# Patient Record
Sex: Male | Born: 1990
Health system: Southern US, Community
[De-identification: ages and names within clinical notes are randomized; demographics above are authoritative.]

## PROBLEM LIST (undated history)

## (undated) DIAGNOSIS — K921 Melena: Secondary | ICD-10-CM

## (undated) DIAGNOSIS — R0683 Snoring: Secondary | ICD-10-CM

## (undated) DIAGNOSIS — I10 Essential (primary) hypertension: Secondary | ICD-10-CM

## (undated) DIAGNOSIS — D649 Anemia, unspecified: Secondary | ICD-10-CM

## (undated) DIAGNOSIS — F419 Anxiety disorder, unspecified: Secondary | ICD-10-CM

## (undated) DIAGNOSIS — B2790 Infectious mononucleosis, unspecified without complication: Secondary | ICD-10-CM

## (undated) DIAGNOSIS — I351 Nonrheumatic aortic (valve) insufficiency: Secondary | ICD-10-CM

## (undated) HISTORY — DX: Nonrheumatic aortic (valve) insufficiency: I35.1

## (undated) HISTORY — DX: Snoring: R06.83

## (undated) HISTORY — DX: Anemia, unspecified: D64.9

## (undated) HISTORY — DX: Melena: K92.1

## (undated) HISTORY — PX: NO PAST SURGERIES: SHX2092

## (undated) HISTORY — DX: Anxiety disorder, unspecified: F41.9

---

## 2012-05-19 ENCOUNTER — Emergency Department (HOSPITAL_COMMUNITY)
Admission: EM | Admit: 2012-05-19 | Discharge: 2012-05-20 | Discharge status: Against Medical Advice | Payer: Self-pay | Attending: Emergency Medicine | Admitting: Emergency Medicine

## 2012-05-19 ENCOUNTER — Encounter (HOSPITAL_COMMUNITY): Payer: Self-pay | Admitting: Emergency Medicine

## 2012-05-19 DIAGNOSIS — K1370 Unspecified lesions of oral mucosa: Secondary | ICD-10-CM | POA: Insufficient documentation

## 2012-05-19 DIAGNOSIS — I10 Essential (primary) hypertension: Secondary | ICD-10-CM | POA: Insufficient documentation

## 2012-05-19 HISTORY — DX: Essential (primary) hypertension: I10

## 2012-05-19 HISTORY — DX: Infectious mononucleosis, unspecified without complication: B27.90

## 2012-05-19 NOTE — ED Notes (Signed)
Pt states he takes blood pressure medication, name unknown, and has been out of it for the past 3 days

## 2012-05-19 NOTE — ED Notes (Signed)
Pt states he has bumps on the inside of his mouth some on the left side and one on the right side  Pt states they do not hurt  Pt states he just noticed them today

## 2012-06-05 ENCOUNTER — Emergency Department (HOSPITAL_COMMUNITY)
Admission: EM | Admit: 2012-06-05 | Discharge: 2012-06-05 | Disposition: A | Discharge status: Home / Self Care | Payer: Self-pay | Attending: Emergency Medicine | Admitting: Emergency Medicine

## 2012-06-05 ENCOUNTER — Encounter (HOSPITAL_COMMUNITY): Payer: Self-pay | Admitting: Emergency Medicine

## 2012-06-05 DIAGNOSIS — J3489 Other specified disorders of nose and nasal sinuses: Secondary | ICD-10-CM | POA: Insufficient documentation

## 2012-06-05 DIAGNOSIS — Z8619 Personal history of other infectious and parasitic diseases: Secondary | ICD-10-CM | POA: Insufficient documentation

## 2012-06-05 DIAGNOSIS — J029 Acute pharyngitis, unspecified: Secondary | ICD-10-CM | POA: Insufficient documentation

## 2012-06-05 DIAGNOSIS — I10 Essential (primary) hypertension: Secondary | ICD-10-CM | POA: Insufficient documentation

## 2012-06-05 DIAGNOSIS — Z79899 Other long term (current) drug therapy: Secondary | ICD-10-CM | POA: Insufficient documentation

## 2012-06-05 MED ORDER — LORATADINE 10 MG PO TABS: 10.0000 mg | ORAL_TABLET | Freq: Every day | ORAL | Status: DC

## 2012-06-05 MED ORDER — OMEPRAZOLE 20 MG PO CPDR: 20.0000 mg | DELAYED_RELEASE_CAPSULE | Freq: Every day | ORAL | Status: DC

## 2012-06-05 NOTE — ED Notes (Signed)
PT. REPORTS SORE THROAT AND NASAL CONGESTION FOR SEVERAL DAYS .

## 2012-06-05 NOTE — ED Provider Notes (Signed)
History     CSN: [id number]  Arrival date & time 06/05/12  0214   First MD Initiated Contact with Patient 06/05/12 [phone]      Chief Complaint  Patient presents with  . Sore Throat  . Nasal Congestion    (Consider location/radiation/quality/duration/timing/severity/associated sxs/prior treatment) Patient is a 22 y.o. male presenting with pharyngitis. The history is provided by the patient.  Sore Throat This is a new problem. The current episode started 3 to 5 hours ago. The problem occurs constantly. The problem has not changed since onset.Nothing aggravates the symptoms. Nothing relieves the symptoms. He has tried nothing for the symptoms.    Past Medical History  Diagnosis Date  . Hypertension   . Mononucleosis     History reviewed. No pertinent past surgical history.  Family History  Problem Relation Age of Onset  . Hypertension Other   . Diabetes Other     History  Substance Use Topics  . Smoking status: Never Smoker   . Smokeless tobacco: Not on file  . Alcohol Use: No      Review of Systems  All other systems reviewed and are negative.    Allergies  Review of patient's allergies indicates no known allergies.  Home Medications   Current Outpatient Rx  Name  Route  Sig  Dispense  Refill  . omeprazole (PRILOSEC) 20 MG capsule   Oral   Take 1 capsule (20 mg total) by mouth daily.   30 capsule   0   . PRESCRIPTION MEDICATION      Blood pressure medication will call pharmacy to update records.         . vitamin C (ASCORBIC ACID) 500 MG tablet   Oral   Take 1,000 mg by mouth daily.           BP 167/103  Pulse 72  Temp(Src) 98.3 F (36.8 C) (Oral)  Resp 14  SpO2 97%  Physical Exam  Constitutional: He is oriented to person, place, and time. He appears well-developed and well-nourished.  HENT:  Head: Normocephalic and atraumatic.  Eyes: Conjunctivae are normal. Pupils are equal, round, and reactive to light.  Neck: Normal range of  motion. Neck supple.  Cardiovascular: Normal rate, regular rhythm, normal heart sounds and intact distal pulses.   Pulmonary/Chest: Effort normal and breath sounds normal.  Abdominal: Soft. Bowel sounds are normal.  Neurological: He is alert and oriented to person, place, and time.  Skin: Skin is warm and dry.  Psychiatric: He has a normal mood and affect. His behavior is normal. Judgment and thought content normal.    ED Course  Procedures (including critical care time)  Labs Reviewed - No data to display No results found.   1. Sore throat       MDM  + sore throat,  Uri sxs.  Noted elevated bp.  Advised to not take any stimulants, and have bp rechecked in the next several days        Emmet Messer Lytle Michaels, MD 06/05/12 [phone]

## 2012-06-05 NOTE — ED Notes (Signed)
Pt discharged.Vital signs stable and GCS 15 

## 2012-06-24 ENCOUNTER — Emergency Department (HOSPITAL_COMMUNITY)
Admission: EM | Admit: 2012-06-24 | Discharge: 2012-06-24 | Disposition: A | Discharge status: Home / Self Care | Payer: Self-pay | Attending: Emergency Medicine | Admitting: Emergency Medicine

## 2012-06-24 ENCOUNTER — Encounter (HOSPITAL_COMMUNITY): Payer: Self-pay | Admitting: Nurse Practitioner

## 2012-06-24 DIAGNOSIS — K219 Gastro-esophageal reflux disease without esophagitis: Secondary | ICD-10-CM | POA: Insufficient documentation

## 2012-06-24 DIAGNOSIS — Z8619 Personal history of other infectious and parasitic diseases: Secondary | ICD-10-CM | POA: Insufficient documentation

## 2012-06-24 DIAGNOSIS — J3489 Other specified disorders of nose and nasal sinuses: Secondary | ICD-10-CM | POA: Insufficient documentation

## 2012-06-24 DIAGNOSIS — I10 Essential (primary) hypertension: Secondary | ICD-10-CM | POA: Insufficient documentation

## 2012-06-24 DIAGNOSIS — J029 Acute pharyngitis, unspecified: Secondary | ICD-10-CM | POA: Insufficient documentation

## 2012-06-24 DIAGNOSIS — Z79899 Other long term (current) drug therapy: Secondary | ICD-10-CM | POA: Insufficient documentation

## 2012-06-24 NOTE — ED Provider Notes (Signed)
History    This chart was scribed for non-physician practitioner Junious Silk, PA-C working with Gwyneth Sprout, MD by Gerlean Ren, ED Scribe. This patient was seen in room TR05C/TR05C and the patient's care was started at 4:55 PM.   CSN: [id number]  Arrival date & time 06/24/12  1426   First MD Initiated Contact with Patient 06/24/12 1556      Chief Complaint  Patient presents with  . Sore Throat     The history is provided by the patient. No language interpreter was used.  Vincent Ibarra is a 22 y.o. male who presents to the Emergency Department complaining of 2 months of waxing-and-waning sore throat that lasts about one hour at a time with associated nasal congestion.  Pt reported here today because he noticed white spots in back of throat this morning.  Pain is worse when waking up in the morning.  No associated painful swallowing.  Pt reports he was diagnosed with acid reflux one week ago for which he has been taking meds he has prescribed.  Pt denies fever, abdominal pain, nausea, emesis. No PCP.    Past Medical History  Diagnosis Date  . Hypertension   . Mononucleosis     History reviewed. No pertinent past surgical history.  Family History  Problem Relation Age of Onset  . Hypertension Other   . Diabetes Other     History  Substance Use Topics  . Smoking status: Never Smoker   . Smokeless tobacco: Not on file  . Alcohol Use: No      Review of Systems  Constitutional: Negative for fever and chills.  HENT: Positive for congestion and sore throat.   Gastrointestinal: Negative for nausea, vomiting and abdominal pain.  All other systems reviewed and are negative.    Allergies  Review of patient's allergies indicates no known allergies.  Home Medications   Current Outpatient Rx  Name  Route  Sig  Dispense  Refill  . loratadine (CLARITIN) 10 MG tablet   Oral   Take 1 tablet (10 mg total) by mouth daily.   30 tablet   0   . omeprazole (PRILOSEC) 20  MG capsule   Oral   Take 1 capsule (20 mg total) by mouth daily.   30 capsule   0   . PRESCRIPTION MEDICATION      Blood pressure medication will call pharmacy to update records.         . vitamin C (ASCORBIC ACID) 500 MG tablet   Oral   Take 1,000 mg by mouth daily.           BP 153/63  Pulse 64  Temp(Src) 98.9 F (37.2 C) (Oral)  Resp 16  SpO2 96%  Physical Exam  Nursing note and vitals reviewed. Constitutional: He is oriented to person, place, and time. He appears well-developed and well-nourished. No distress.  HENT:  Head: Normocephalic and atraumatic.  Right Ear: Tympanic membrane, external ear and ear canal normal.  Left Ear: Tympanic membrane, external ear and ear canal normal.  Nose: Nose normal.  Mouth/Throat: Uvula is midline, oropharynx is clear and moist and mucous membranes are normal.  Bilateral TM normal  Eyes: Conjunctivae are normal.  Neck: Normal range of motion. No tracheal deviation present.  No lymphadenopathy  Cardiovascular: Normal rate, regular rhythm and normal heart sounds.   Pulmonary/Chest: Effort normal and breath sounds normal. No stridor.  Abdominal: Soft. He exhibits no distension. There is no tenderness.  Musculoskeletal: Normal range of motion.  Neurological: He is alert and oriented to person, place, and time.  Skin: Skin is warm and dry. He is not diaphoretic.  Psychiatric: He has a normal mood and affect. His behavior is normal.    ED Course  Procedures (including critical care time) DIAGNOSTIC STUDIES: Oxygen Saturation is 96% on room air, adequate by my interpretation.    COORDINATION OF CARE: 5:00 PM- Informed pt that strep test was negative and that pt does not appear to have signs of infection.  Pt requests resource list for establishing contact with local PCP.  Informed pt of strict return precautions.  Pt verbalizes understanding.    Results for orders placed during the hospital encounter of 06/24/12  RAPID STREP  SCREEN      Result Value Range   Streptococcus, Group A Screen (Direct) NEGATIVE  NEGATIVE    No results found.   1. Pharyngitis       MDM  Patient presents for a waxing and waning sore throat for 2 months. Noticed a white spot on his throat this am and decided to get checked out for strep. Rapid strep negative. Afebrile. No other complaints. Followed by ENT for this problem. No signs of any acute pathology. Resource guide given to establish care with PCP in the community. Return precautions given. VSS for discharge. Patient / Family / Caregiver informed of clinical course, understand medical decision-making process, and agree with plan.       I personally performed the services described in this documentation, which was scribed in my presence. The recorded information has been reviewed and is accurate.    Mora Bellman, PA-C 06/25/12 1358

## 2012-06-24 NOTE — ED Notes (Signed)
Spoke with pt regarding importance of followup with a PCP regarding his BP. During discharge pt stated that he did followup with an ENT doctor and he was given a rx for antibiotics, pt states he has not finished taking the ones rx'd by ED or ENT. Advised pt to continue taking medications that the ENT asked him to. Pt verbalized understanding.

## 2012-06-24 NOTE — ED Notes (Signed)
Pt reports he was here last week for sore throat and diagnosed with acid reflux but sore throat persists and now has white spots in his throat. A&Ox4, resp e/u

## 2012-06-25 NOTE — ED Provider Notes (Signed)
Medical screening examination/treatment/procedure(s) were performed by non-physician practitioner and as supervising physician I was immediately available for consultation/collaboration.   Gwyneth Sprout, MD 06/25/12 2147

## 2013-02-02 ENCOUNTER — Encounter (HOSPITAL_COMMUNITY): Payer: Self-pay | Admitting: Emergency Medicine

## 2013-02-02 ENCOUNTER — Emergency Department (HOSPITAL_COMMUNITY)
Admission: EM | Admit: 2013-02-02 | Discharge: 2013-02-02 | Disposition: A | Discharge status: Home / Self Care | Payer: Self-pay | Attending: Emergency Medicine | Admitting: Emergency Medicine

## 2013-02-02 DIAGNOSIS — I10 Essential (primary) hypertension: Secondary | ICD-10-CM | POA: Insufficient documentation

## 2013-02-02 DIAGNOSIS — J029 Acute pharyngitis, unspecified: Secondary | ICD-10-CM | POA: Insufficient documentation

## 2013-02-02 DIAGNOSIS — Z8619 Personal history of other infectious and parasitic diseases: Secondary | ICD-10-CM | POA: Insufficient documentation

## 2013-02-02 DIAGNOSIS — Z79899 Other long term (current) drug therapy: Secondary | ICD-10-CM | POA: Insufficient documentation

## 2013-02-02 LAB — RAPID STREP SCREEN (MED CTR MEBANE ONLY): Streptococcus, Group A Screen (Direct): NEGATIVE

## 2013-02-02 MED ORDER — ACETAMINOPHEN 325 MG PO TABS
650.0000 mg | ORAL_TABLET | Freq: Once | ORAL | Status: AC
  Administered 2013-02-02: 650 mg via ORAL
  Filled 2013-02-02: qty 2

## 2013-02-02 NOTE — ED Notes (Signed)
Pt was seen by the pa before myself.  Alert no distress

## 2013-02-02 NOTE — ED Notes (Signed)
Present with chills, sore thraot worse with swallowing that began last night. Reports fever of 100.8 today. C/o of stuffy nose for 6 months. VSS.

## 2013-02-02 NOTE — ED Provider Notes (Signed)
CSN: [id number]     Arrival date & time 02/02/13  2136 History   None    This chart was scribed for non-physician practitioner, Roxy Horseman PA-C,  working with Roney Marion, MD by Arlan Organ, ED Scribe. This patient was seen in room TR07C/TR07C and the patient's care was started at 10:25 PM.    Chief Complaint  Patient presents with  . Sore Throat   The history is provided by the patient. No language interpreter was used.   HPI Comments: Vincent Ibarra is a 22 y.o. male with a hx of monocucleosis who presents to the Emergency Department complaining of a sudden onset, unchanged, constant sore throat that started last night. Pt also reports dysphagia, chills, rhinorrhea, dry cough, and a subjective fever. He says he has tried dayquil with mild temporary relief. Pt denies nasal congestion.  Past Medical History  Diagnosis Date  . Hypertension   . Mononucleosis    History reviewed. No pertinent past surgical history. Family History  Problem Relation Age of Onset  . Hypertension Other   . Diabetes Other    History  Substance Use Topics  . Smoking status: Never Smoker   . Smokeless tobacco: Not on file  . Alcohol Use: No    Review of Systems A complete 10 system review of systems was obtained and all systems are negative except as noted in the HPI and PMH.    Allergies  Review of patient's allergies indicates no known allergies.  Home Medications   Current Outpatient Rx  Name  Route  Sig  Dispense  Refill  . loratadine (CLARITIN) 10 MG tablet   Oral   Take 1 tablet (10 mg total) by mouth daily.   30 tablet   0   . omeprazole (PRILOSEC) 20 MG capsule   Oral   Take 1 capsule (20 mg total) by mouth daily.   30 capsule   0   . PRESCRIPTION MEDICATION      Blood pressure medication will call pharmacy to update records.         . vitamin C (ASCORBIC ACID) 500 MG tablet   Oral   Take 1,000 mg by mouth daily.          Triage Vitals: BP 156/75  Pulse 104   Temp(Src) 100.8 F (38.2 C) (Oral)  Resp 18  Wt 163 lb 8 oz (74.163 kg)  SpO2 98%  Physical Exam  Nursing note and vitals reviewed. Constitutional: He is oriented to person, place, and time. He appears well-developed and well-nourished.  No sick contacts   HENT:  Head: Normocephalic and atraumatic.  Right Ear: Tympanic membrane is not erythematous.  Left Ear: Tympanic membrane normal. Tympanic membrane is not erythematous.  Mouth/Throat: No oropharyngeal exudate.  No sinus pressure, oropharynx is mildly erythematous, no signs of tonsillar or peritonsillar abscess, uvula is midline, airway is intact  Eyes: EOM are normal.  Neck: Normal range of motion.  Cardiovascular: Normal rate.   Pulmonary/Chest: Effort normal.  Musculoskeletal: Normal range of motion.  Neck stiffness  Lymphadenopathy:    He has no cervical adenopathy.  Neurological: He is alert and oriented to person, place, and time.  Skin: Skin is warm and dry.  Psychiatric: He has a normal mood and affect. His behavior is normal.    ED Course  Procedures (including critical care time)  DIAGNOSTIC STUDIES: Oxygen Saturation is 98% on RA, Normal by my interpretation.    COORDINATION OF CARE: 10:26 PM- Will order strep  screening. Discussed treatment plan with pt at bedside and pt agreed to plan.      Results for orders placed during the hospital encounter of 02/02/13  RAPID STREP SCREEN      Result Value Range   Streptococcus, Group A Screen (Direct) NEGATIVE  NEGATIVE   No results found.    EKG Interpretation   None       MDM   1. Viral pharyngitis    Pt afebrile without tonsillar exudate, negative strep. Presents with mild cervical lymphadenopathy, & dysphagia; diagnosis of viral pharyngitis. No abx indicated. DC w symptomatic tx for pain  Pt does not appear dehydrated, but did discuss importance of water rehydration. Presentation non concerning for PTA or infxn spread to soft tissue. No trismus or  uvula deviation. Specific return precautions discussed. Pt able to drink water in ED without difficulty with intact air way. Recommended PCP follow up.   I personally performed the services described in this documentation, which was scribed in my presence. The recorded information has been reviewed and is accurate.     Roxy Horseman, PA-C 02/02/13 2338

## 2013-02-04 LAB — CULTURE, GROUP A STREP

## 2013-02-05 NOTE — ED Provider Notes (Signed)
Medical screening examination/treatment/procedure(s) were performed by non-physician practitioner and as supervising physician I was immediately available for consultation/collaboration.  EKG Interpretation   None         Roney Marion, MD 02/05/13 [phone]

## 2013-08-10 ENCOUNTER — Emergency Department (HOSPITAL_COMMUNITY)
Admission: EM | Admit: 2013-08-10 | Discharge: 2013-08-10 | Disposition: A | Discharge status: Home / Self Care | Payer: Self-pay | Attending: Emergency Medicine | Admitting: Emergency Medicine

## 2013-08-10 ENCOUNTER — Encounter (HOSPITAL_COMMUNITY): Payer: Self-pay | Admitting: Emergency Medicine

## 2013-08-10 DIAGNOSIS — L02419 Cutaneous abscess of limb, unspecified: Secondary | ICD-10-CM | POA: Insufficient documentation

## 2013-08-10 DIAGNOSIS — L03119 Cellulitis of unspecified part of limb: Principal | ICD-10-CM

## 2013-08-10 DIAGNOSIS — L0291 Cutaneous abscess, unspecified: Secondary | ICD-10-CM

## 2013-08-10 DIAGNOSIS — I10 Essential (primary) hypertension: Secondary | ICD-10-CM | POA: Insufficient documentation

## 2013-08-10 NOTE — ED Notes (Signed)
Pt c/o abscess to right upper thigh x's 5 days.  St's has had some drainage

## 2013-08-10 NOTE — Discharge Instructions (Signed)
Abscess Care After An abscess (also called a boil or furuncle) is an infected area that contains a collection of pus. Signs and symptoms of an abscess include pain, tenderness, redness, or hardness, or you may feel a moveable soft area under your skin. An abscess can occur anywhere in the body. The infection may spread to surrounding tissues causing cellulitis. A cut (incision) by the surgeon was made over your abscess and the pus was drained out. Gauze may have been packed into the space to provide a drain that will allow the cavity to heal from the inside outwards. The boil may be painful for 5 to 7 days. Most people with a boil do not have high fevers. Your abscess, if seen early, may not have localized, and may not have been lanced. If not, another appointment may be required for this if it does not get better on its own or with medications. HOME CARE INSTRUCTIONS   Only take over-the-counter or prescription medicines for pain, discomfort, or fever as directed by your caregiver.  When you bathe, soak and then remove gauze or iodoform packs at least daily or as directed by your caregiver. You may then wash the wound gently with mild soapy water. Repack with gauze or do as your caregiver directs. SEEK IMMEDIATE MEDICAL CARE IF:   You develop increased pain, swelling, redness, drainage, or bleeding in the wound site.  You develop signs of generalized infection including muscle aches, chills, fever, or a general ill feeling.  An oral temperature above 102 F (38.9 C) develops, not controlled by medication. See your caregiver for a recheck if you develop any of the symptoms described above. If medications (antibiotics) were prescribed, take them as directed. Document Released: 09/15/2004 Document Revised: 05/22/2011 Document Reviewed: 05/13/2007 [facility] Patient Information 2014 [place], [place]. The area will drain for the next couple of days  Warm compress to area 2-3 times a day for the next 1-2  days

## 2013-08-10 NOTE — ED Provider Notes (Signed)
CSN: [id number]     Arrival date & time 08/10/13  2005 History   None    This chart was scribed for non-physician practitioner, Earley FavorGail Sephiroth Mcluckie, FNP working with Dagmar HaitWilliam Blair Walden, MD by Arlan OrganAshley Leger, ED Scribe. This patient was seen in room TR11C/TR11C and the patient's care was started at 8:18 PM.   Chief Complaint  Patient presents with  . Abscess   HPI  HPI Comments: Vincent Ibarra is a 23 y.o. Male with a PMHx of HTN who presents to the Emergency Department complaining of an abscess to the R upper thigh onset 5 days. Pt also reports some drainage from the area. States he opened the area with a safety pin a few days ago. He has not tried any OTC medications for discomfort. No warm compresses to help with drainage. At this time he denies any fever or chills. He has no other pertinent past medical history. No other concerns this visit.  Past Medical History  Diagnosis Date  . Hypertension   . Mononucleosis    History reviewed. No pertinent past surgical history. Family History  Problem Relation Age of Onset  . Hypertension Other   . Diabetes Other    History  Substance Use Topics  . Smoking status: Never Smoker   . Smokeless tobacco: Not on file  . Alcohol Use: No    Review of Systems  Constitutional: Negative for fever and chills.  HENT: Negative for congestion.   Eyes: Negative for redness.  Respiratory: Negative for cough.   Skin: Positive for wound (Abscess to R upper thigh). Negative for rash.  Psychiatric/Behavioral: Negative for confusion.      Allergies  Review of patient's allergies indicates no known allergies.  Home Medications   Prior to Admission medications   Medication Sig Start Date End Date Taking? Authorizing Provider  loratadine (CLARITIN) 10 MG tablet Take 1 tablet (10 mg total) by mouth daily. 06/05/12   Chionesu Lytle MichaelsK Sonyika, MD  Pseudoephedrine-APAP-DM (DAYQUIL MULTI-SYMPTOM COLD/FLU PO) Take 30 mLs by mouth every 4 (four) hours as needed  (for cold).    Historical Provider, MD   Triage Vitals: BP 154/82  Pulse 94  Temp(Src) 98.4 F (36.9 C) (Oral)  Resp 18  SpO2 100%   Physical Exam  Nursing note and vitals reviewed. Constitutional: He is oriented to person, place, and time. He appears well-developed and well-nourished.  HENT:  Head: Normocephalic.  Eyes: EOM are normal.  Neck: Normal range of motion.  Pulmonary/Chest: Effort normal.  Abdominal: He exhibits no distension.  Musculoskeletal: Normal range of motion.  Neurological: He is alert and oriented to person, place, and time.  Skin:  10-12 cm area of induration with a 3 cm central firm area to R upper thigh 3 mm opening with drainage to R upper thigh  Psychiatric: He has a normal mood and affect.    ED Course  INCISION AND DRAINAGE Date/Time: 08/10/2013 8:59 PM Performed by: Arman FilterSCHULZ, Shawna Kiener K Authorized by: Arman FilterSCHULZ, Paizley Ramella K Consent: Verbal consent obtained. written consent not obtained. Consent given by: patient Patient understanding: patient states understanding of the procedure being performed Patient identity confirmed: verbally with patient Time out: Immediately prior to procedure a "time out" was called to verify the correct patient, procedure, equipment, support staff and site/side marked as required. Type: abscess Body area: lower extremity Location details: right leg Anesthesia: local infiltration Local anesthetic: lidocaine 1% without epinephrine Patient sedated: no Scalpel size: 11 Complexity: simple Drainage: purulent Drainage amount: moderate Wound treatment:  wound left open Patient tolerance: Patient tolerated the procedure well with no immediate complications. Comments: Small seroma expressed    (including critical care time)  DIAGNOSTIC STUDIES: Oxygen Saturation is 100% on RA, Normal by my interpretation.    COORDINATION OF CARE: 8:23 PM- Will perform I&D. Discussed treatment plan with pt at bedside and pt agreed to plan.      8:49 PM- INCISION AND DRAINAGE PROCEDURE NOTE: Patient identification was confirmed and verbal consent was obtained. This procedure was performed by Earley Favor, FNP at 8:49 PM. Site: R upper thigh Sterile procedures observed Anesthetic used (type and amt): 2 cc of 2% lidocaine without epinephrine Drainage: moderate with small seroma  Complexity: Simple Packing used: None Site anesthetized, incision made over site, wound drained and explored loculations, rinsed with copious amounts of normal saline, wound packed with sterile gauze, covered with dry, sterile dressing.  Pt tolerated procedure well without complications.  Instructions for care discussed verbally and pt provided with additional written instructions for homecare and f/u.   Labs Review Labs Reviewed - No data to display  Imaging Review No results found.   EKG Interpretation None      MDM   Final diagnoses:  Abscess       I personally performed the services described in this documentation, which was scribed in my presence. The recorded information has been reviewed and is accurate.    Arman Filter, NP 08/10/13 2106

## 2013-08-12 NOTE — ED Provider Notes (Signed)
Medical screening examination/treatment/procedure(s) were performed by non-physician practitioner and as supervising physician I was immediately available for consultation/collaboration.   EKG Interpretation None        Dagmar Hait, MD 08/12/13 [phone]

## 2015-09-04 ENCOUNTER — Emergency Department (HOSPITAL_COMMUNITY)
Admission: EM | Admit: 2015-09-04 | Discharge: 2015-09-04 | Disposition: A | Discharge status: Home / Self Care | Payer: BLUE CROSS/BLUE SHIELD | Attending: Emergency Medicine | Admitting: Emergency Medicine

## 2015-09-04 ENCOUNTER — Encounter (HOSPITAL_COMMUNITY): Payer: Self-pay

## 2015-09-04 DIAGNOSIS — Y999 Unspecified external cause status: Secondary | ICD-10-CM | POA: Insufficient documentation

## 2015-09-04 DIAGNOSIS — Z79899 Other long term (current) drug therapy: Secondary | ICD-10-CM | POA: Diagnosis not present

## 2015-09-04 DIAGNOSIS — Z791 Long term (current) use of non-steroidal anti-inflammatories (NSAID): Secondary | ICD-10-CM | POA: Diagnosis not present

## 2015-09-04 DIAGNOSIS — Y92410 Unspecified street and highway as the place of occurrence of the external cause: Secondary | ICD-10-CM | POA: Insufficient documentation

## 2015-09-04 DIAGNOSIS — I10 Essential (primary) hypertension: Secondary | ICD-10-CM | POA: Diagnosis not present

## 2015-09-04 DIAGNOSIS — Y939 Activity, unspecified: Secondary | ICD-10-CM | POA: Insufficient documentation

## 2015-09-04 DIAGNOSIS — M545 Low back pain, unspecified: Secondary | ICD-10-CM

## 2015-09-04 MED ORDER — NAPROXEN 500 MG PO TABS: 500.0000 mg | ORAL_TABLET | Freq: Two times a day (BID) | ORAL | Status: DC

## 2015-09-04 MED ORDER — LIDOCAINE 5 % EX PTCH: 1.0000 | MEDICATED_PATCH | CUTANEOUS | Status: DC

## 2015-09-04 MED ORDER — METHOCARBAMOL 500 MG PO TABS: 500.0000 mg | ORAL_TABLET | Freq: Two times a day (BID) | ORAL | Status: DC
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
## 2016-09-01 ENCOUNTER — Encounter (HOSPITAL_COMMUNITY): Payer: Self-pay | Admitting: Nurse Practitioner

## 2016-09-01 ENCOUNTER — Emergency Department (HOSPITAL_COMMUNITY)
Admission: EM | Admit: 2016-09-01 | Discharge: 2016-09-02 | Disposition: A | Discharge status: Home / Self Care | Payer: BLUE CROSS/BLUE SHIELD | Attending: Emergency Medicine | Admitting: Emergency Medicine

## 2016-09-01 DIAGNOSIS — R590 Localized enlarged lymph nodes: Secondary | ICD-10-CM | POA: Insufficient documentation

## 2016-09-01 DIAGNOSIS — Z79899 Other long term (current) drug therapy: Secondary | ICD-10-CM | POA: Insufficient documentation

## 2016-09-01 DIAGNOSIS — I10 Essential (primary) hypertension: Secondary | ICD-10-CM | POA: Insufficient documentation

## 2016-09-01 LAB — CBC WITH DIFFERENTIAL/PLATELET
Basophils Absolute: 0 10*3/uL (ref 0.0–0.1)
Basophils Relative: 0 %
Eosinophils Absolute: 0 10*3/uL (ref 0.0–0.7)
Eosinophils Relative: 0 %
HCT: 39 % (ref 39.0–52.0)
Hemoglobin: 13.4 g/dL (ref 13.0–17.0)
LYMPHS ABS: 2.9 10*3/uL (ref 0.7–4.0)
LYMPHS PCT: 34 %
MCH: 28.8 pg (ref 26.0–34.0)
MCHC: 34.4 g/dL (ref 30.0–36.0)
MCV: 83.9 fL (ref 78.0–100.0)
MONO ABS: 0.9 10*3/uL (ref 0.1–1.0)
Monocytes Relative: 11 %
Neutro Abs: 4.6 10*3/uL (ref 1.7–7.7)
Neutrophils Relative %: 55 %
Platelets: 237 10*3/uL (ref 150–400)
RBC: 4.65 MIL/uL (ref 4.22–5.81)
RDW: 13.2 % (ref 11.5–15.5)
WBC: 8.5 10*3/uL (ref 4.0–10.5)

## 2016-09-01 LAB — BASIC METABOLIC PANEL
Anion gap: 9 (ref 5–15)
BUN: 11 mg/dL (ref 6–20)
CHLORIDE: 106 mmol/L (ref 101–111)
CO2: 26 mmol/L (ref 22–32)
Calcium: 9.7 mg/dL (ref 8.9–10.3)
Creatinine, Ser: 0.93 mg/dL (ref 0.61–1.24)
GFR calc Af Amer: >60 mL/min (ref >60)
GFR calc non Af Amer: >60 mL/min (ref >60)
GLUCOSE: 95 mg/dL (ref 65–99)
POTASSIUM: 3.7 mmol/L (ref 3.5–5.1)
Sodium: 141 mmol/L (ref 135–145)

## 2016-09-01 MED ORDER — LORAZEPAM 1 MG PO TABS
1.0000 mg | ORAL_TABLET | Freq: Once | ORAL | Status: AC
Start: 2016-09-01 — End: 2016-09-01
  Administered 2016-09-01: 1 mg via ORAL
  Filled 2016-09-01: qty 1

## 2016-09-01 NOTE — ED Triage Notes (Signed)
Pt states he is "concerned that he might have some sort lymphoma." Adds that, he "did some research on his symptoms of, swelling(s) on his neck, axilla, and groin, unexplained headaches and lack of appetite, and they all pointed to the lymphoma." He state he had mono while in high school and he recently stopped smoking marijuana due to his new job.

## 2016-09-02 LAB — MONONUCLEOSIS SCREEN: Mono Screen: NEGATIVE

## 2016-09-02 NOTE — ED Provider Notes (Signed)
[facility]-EMERGENCY DEPT Provider Note   CSN: [id number] Arrival date & time: 09/01/16  2049     History   Chief Complaint Chief Complaint  Patient presents with  . Lymphadenopathy  . Sore Throat    HPI Vincent Ibarra is a 26 y.o. male.  HPI Patient is a 26 year old male who reports that he stop smoking cannabis approximately 2 weeks ago secondary to a new job.  He states increasing anxiety and new lymphadenopathy in his neck and groin.  He is concerned that he could have some type of lymphoma.  He's had mild decreased oral intake.  He feels like he's had some weight loss.  He states this feels similar to when he had mono.  He reports mild sore throat.  No fevers or chills.  No new rash.  No headache or neck stiffness.  No other complaints.   Past Medical History:  Diagnosis Date  . Hypertension   . Mononucleosis     There are no active problems to display for this patient.   History reviewed. No pertinent surgical history.     Home Medications    Prior to Admission medications   Medication Sig Start Date End Date Taking? Authorizing Provider  amLODipine (NORVASC) 10 MG tablet Take 10 mg by mouth daily.   Yes [provider]  ibuprofen (ADVIL,MOTRIN) 200 MG tablet Take 200 mg by mouth every 6 (six) hours as needed for fever, headache, mild pain or moderate pain.   Yes [provider]    Family History Family History  Problem Relation Age of Onset  . Hypertension Other   . Diabetes Other     Social History Social History  Substance Use Topics  . Smoking status: Never Smoker  . Smokeless tobacco: Never Used  . Alcohol use No     Allergies   Patient has no known allergies.   Review of Systems Review of Systems  All other systems reviewed and are negative.    Physical Exam Updated Vital Signs BP (!) 150/85 (BP Location: Left Arm)   Pulse 62   Temp 98.8 F (37.1 C) (Oral)   Resp 18   Wt 68.8 kg (151 lb 9.6 oz)   SpO2 100%     Physical Exam  Constitutional: He is oriented to person, place, and time. He appears well-developed and well-nourished.  HENT:  Head: Normocephalic and atraumatic.  Eyes: EOM are normal.  Neck: Normal range of motion. Neck supple. No tracheal deviation present. No thyromegaly present.  Cardiovascular: Normal rate, regular rhythm, normal heart sounds and intact distal pulses.   Pulmonary/Chest: Effort normal and breath sounds normal. No stridor. No respiratory distress.  Abdominal: Soft. He exhibits no distension. There is no tenderness.  Musculoskeletal: Normal range of motion.  Bilateral groin adenopathy which is shotty in nature  Lymphadenopathy:    He has cervical adenopathy.  Neurological: He is alert and oriented to person, place, and time.  Skin: Skin is warm and dry.  Psychiatric:  Anxious with slightly pressured speech  Nursing note and vitals reviewed.    ED Treatments / Results  Labs (all labs ordered are listed, but only abnormal results are displayed) Labs Reviewed  CBC WITH DIFFERENTIAL/PLATELET  BASIC METABOLIC PANEL  MONONUCLEOSIS SCREEN    EKG  EKG Interpretation None       Radiology No results found.  Procedures Procedures (including critical care time)  Medications Ordered in ED Medications  LORazepam (ATIVAN) tablet 1 mg (1 mg Oral Given 09/01/16  2307)     Initial Impression / Assessment and Plan / ED Course  I have reviewed the triage vital signs and the nursing notes.  Pertinent labs & imaging results that were available during my care of the patient were reviewed by me and considered in my medical decision making (see chart for details).     Mild cervical lymphadenopathy and groin lymphadenopathy.  Labs and mono screen within normal limits.  No clear etiology.  He seems somewhat anxious at this time and I suspect he's developed some anxiety after stopping smoking cannabis.  He likely was self-medicating with cannabis.  I recommended  outpatient behavioral health follow-up  Final Clinical Impressions(s) / ED Diagnoses   Final diagnoses:  Cervical lymphadenopathy    New Prescriptions New Prescriptions   No medications on file     Azalia Bilis, MD 09/02/16 [phone]

## 2016-09-04 ENCOUNTER — Emergency Department (HOSPITAL_COMMUNITY)
Admission: EM | Admit: 2016-09-04 | Discharge: 2016-09-04 | Disposition: A | Discharge status: Home / Self Care | Payer: BLUE CROSS/BLUE SHIELD | Attending: Emergency Medicine | Admitting: Emergency Medicine

## 2016-09-04 ENCOUNTER — Encounter (HOSPITAL_COMMUNITY): Payer: Self-pay | Admitting: *Deleted

## 2016-09-04 DIAGNOSIS — I10 Essential (primary) hypertension: Secondary | ICD-10-CM | POA: Insufficient documentation

## 2016-09-04 DIAGNOSIS — K2900 Acute gastritis without bleeding: Secondary | ICD-10-CM

## 2016-09-04 LAB — URINALYSIS, ROUTINE W REFLEX MICROSCOPIC
Bilirubin Urine: NEGATIVE
Glucose, UA: NEGATIVE mg/dL
HGB URINE DIPSTICK: NEGATIVE
Ketones, ur: NEGATIVE mg/dL
LEUKOCYTES UA: NEGATIVE
Nitrite: NEGATIVE
PROTEIN: NEGATIVE mg/dL
SPECIFIC GRAVITY, URINE: 1.004 — AB (ref 1.005–1.030)
pH: 6 (ref 5.0–8.0)

## 2016-09-04 LAB — CBC
HCT: 40.1 % (ref 39.0–52.0)
Hemoglobin: 13.3 g/dL (ref 13.0–17.0)
MCH: 28.1 pg (ref 26.0–34.0)
MCHC: 33.2 g/dL (ref 30.0–36.0)
MCV: 84.8 fL (ref 78.0–100.0)
PLATELETS: 229 10*3/uL (ref 150–400)
RBC: 4.73 MIL/uL (ref 4.22–5.81)
RDW: 13.1 % (ref 11.5–15.5)
WBC: 7.2 10*3/uL (ref 4.0–10.5)

## 2016-09-04 LAB — COMPREHENSIVE METABOLIC PANEL
ALK PHOS: 52 U/L (ref 38–126)
ALT: 17 U/L (ref 17–63)
AST: 18 U/L (ref 15–41)
Albumin: 4.7 g/dL (ref 3.5–5.0)
Anion gap: 8 (ref 5–15)
BUN: 8 mg/dL (ref 6–20)
CALCIUM: 9.3 mg/dL (ref 8.9–10.3)
CHLORIDE: 105 mmol/L (ref 101–111)
CO2: 25 mmol/L (ref 22–32)
Creatinine, Ser: 0.93 mg/dL (ref 0.61–1.24)
Glucose, Bld: 97 mg/dL (ref 65–99)
Potassium: 3.7 mmol/L (ref 3.5–5.1)
Sodium: 138 mmol/L (ref 135–145)
Total Bilirubin: 0.5 mg/dL (ref 0.3–1.2)
Total Protein: 7.7 g/dL (ref 6.5–8.1)

## 2016-09-04 LAB — LIPASE, BLOOD: Lipase: 24 U/L (ref 11–51)

## 2016-09-04 MED ORDER — OMEPRAZOLE 20 MG PO CPDR: 20.0000 mg | DELAYED_RELEASE_CAPSULE | Freq: Every day | ORAL | 0 refills | Status: DC

## 2016-09-04 MED ORDER — GI COCKTAIL ~~LOC~~
30.0000 mL | Freq: Once | ORAL | Status: AC
  Administered 2016-09-04: 30 mL via ORAL
  Filled 2016-09-04: qty 30

## 2016-09-04 NOTE — ED Triage Notes (Signed)
To ED for eval of lower abd pain for past week. States pain has become worse in abd. Frequent urnation.  Reports diarrhea but no vomiting. Also with sore throat. States he has hx of mono.

## 2016-09-04 NOTE — Discharge Instructions (Signed)
Please read attached information. If you experience any new or worsening signs or symptoms please return to the emergency room for evaluation. Please follow-up with your primary care provider or specialist as discussed. Please use medication prescribed only as directed and discontinue taking if you have any concerning signs or symptoms.   °

## 2016-09-04 NOTE — ED Provider Notes (Signed)
[facility]-EMERGENCY DEPT Provider Note   CSN: [id number] Arrival date & time: 09/04/16  [id number]     History   Chief Complaint Chief Complaint  Patient presents with  . Abdominal Pain    HPI Vincent Ibarra is a 26 y.o. male.  HPI   26 year old male presents today with numerous complaints.  Patient notes approximately 1.5 weeks ago he felt swelling in his lymph nodes in his groin and neck.  He reports he went online and read information reporting that he could have cancer.  He notes at that time he developed minor intermittent epigastric abdominal discomfort.  He describes this as pressure.  He notes this pain is improved with drinking water, worsened with drinking food, and happens directly after intake.  At the time of my evaluation patient reports he is not having abdominal pain as he just had some water.  He notes the lymph nodes in the neck are bilateral and have improved, but still reports some lymphadenopathy of his groin.  He denies any lower abdominal pain, dysuria, discharge, testicular pain.  Patient reports he recently went on a fast and lost a small amount of weight, but no significant weight loss recently.  Denies any fevers, vomiting, bloody stool.  Patient denies any history of cancer.  Patient was seen at [facility] with basic labs showing no significant finding in discharge at that time.  At that time patient had a mono screen, BMP, CBC (all of which showed no significant findings).  Patient notes he was tested for STDs approximately 30 days ago, but has been sexually active with his girlfriend.  Patient denies any alcohol use, reports that he quit smoking marijuana approximately 1 month ago.    Past Medical History:  Diagnosis Date  . Hypertension   . Mononucleosis     There are no active problems to display for this patient.   History reviewed. No pertinent surgical history.     Home Medications    Prior to Admission medications   Medication Sig Start Date  End Date Taking? Authorizing Provider  amLODipine (NORVASC) 10 MG tablet Take 10 mg by mouth daily.    [provider]  ibuprofen (ADVIL,MOTRIN) 200 MG tablet Take 200 mg by mouth every 6 (six) hours as needed for fever, headache, mild pain or moderate pain.    [provider]  omeprazole (PRILOSEC) 20 MG capsule Take 1 capsule (20 mg total) by mouth daily. 09/04/16   Eyvonne MechanicHedges, Jabe Jeanbaptiste, PA-C    Family History Family History  Problem Relation Age of Onset  . Hypertension Other   . Diabetes Other     Social History Social History  Substance Use Topics  . Smoking status: Never Smoker  . Smokeless tobacco: Never Used  . Alcohol use No     Allergies   Patient has no known allergies.   Review of Systems Review of Systems  All other systems reviewed and are negative.    Physical Exam Updated Vital Signs BP (!) 148/97 (BP Location: Right Arm)   Pulse (!) 47   Temp 98.5 F (36.9 C) (Oral)   Resp 16   SpO2 100%   Physical Exam  Constitutional: He is oriented to person, place, and time. He appears well-developed and well-nourished.  HENT:  Head: Normocephalic and atraumatic.  Eyes: Conjunctivae are normal. Pupils are equal, round, and reactive to light. Right eye exhibits no discharge. Left eye exhibits no discharge. No scleral icterus.  Neck: Normal range of motion. No JVD  present. No tracheal deviation present.  Pulmonary/Chest: Effort normal. No stridor.  Abdominal: Soft. He exhibits no distension and no mass. There is no tenderness. There is no rebound and no guarding. No hernia.  Bilateral tender inguinal lymph nodes, no abd /axillay noted  Musculoskeletal: Normal range of motion. He exhibits no edema or tenderness.  Lymphadenopathy:    He has no cervical adenopathy.  Neurological: He is alert and oriented to person, place, and time. Coordination normal.  Skin: Skin is warm. No rash noted.  Psychiatric: He has a normal mood and affect. His behavior is  normal. Judgment and thought content normal.  Nursing note and vitals reviewed.    ED Treatments / Results  Labs (all labs ordered are listed, but only abnormal results are displayed) Labs Reviewed  URINALYSIS, ROUTINE W REFLEX MICROSCOPIC - Abnormal; Notable for the following:       Result Value   Color, Urine STRAW (*)    Specific Gravity, Urine 1.004 (*)    All other components within normal limits  COMPREHENSIVE METABOLIC PANEL  CBC  LIPASE, BLOOD  GC/CHLAMYDIA PROBE AMP ([place]) NOT AT [facility]    EKG  EKG Interpretation None       Radiology No results found.  Procedures Procedures (including critical care time)  Medications Ordered in ED Medications  gi cocktail (Maalox,Lidocaine,Donnatal) (30 mLs Oral Given 09/04/16 1045)     Initial Impression / Assessment and Plan / ED Course  I have reviewed the triage vital signs and the nursing notes.  Pertinent labs & imaging results that were available during my care of the patient were reviewed by me and considered in my medical decision making (see chart for details).      Final Clinical Impressions(s) / ED Diagnoses   Final diagnoses:  Acute gastritis without hemorrhage, unspecified gastritis type   Labs: Lipase, urinalysis, CMP, CBC  Imaging:  Consults:  Therapeutics: GI cocktail  Discharge Meds: Omeprazole  Assessment/Plan: 26 year old male presents today with complaints of abdominal discomfort and lymphadenopathy.  Patient is anxious while here in the ED.  He has numerous concerns.  His laboratory analysis shows no significant findings.  He has a soft benign nontender abdomen.  Patient was not having any abdominal pain at the time my evaluation.  His presentation is most consistent with gastritis.  Patient also reporting he was having some soreness in his neck, no lymphadenopathy in the neck, this is likely secondary to indigestion.  Patient will be started on Prilosec, outpatient GI follow-up, with  strict return precautions.  Patient verbalized understanding and agreement to this plan and had no further questions or concerns.      New Prescriptions Discharge Medication List as of 09/04/2016 12:25 PM    START taking these medications   Details  omeprazole (PRILOSEC) 20 MG capsule Take 1 capsule (20 mg total) by mouth daily., Starting Mon 09/04/2016, Print         Loie Jahr, [place], PA-C 09/04/16 1404    Arby Barrette, MD 09/15/16 [phone]

## 2017-01-20 ENCOUNTER — Emergency Department (HOSPITAL_COMMUNITY)
Admission: EM | Admit: 2017-01-20 | Discharge: 2017-01-21 | Disposition: A | Discharge status: Home / Self Care | Payer: Self-pay | Attending: Emergency Medicine | Admitting: Emergency Medicine

## 2017-01-20 ENCOUNTER — Emergency Department (HOSPITAL_COMMUNITY): Payer: Self-pay

## 2017-01-20 ENCOUNTER — Encounter (HOSPITAL_COMMUNITY): Payer: Self-pay

## 2017-01-20 DIAGNOSIS — R002 Palpitations: Secondary | ICD-10-CM | POA: Insufficient documentation

## 2017-01-20 DIAGNOSIS — I10 Essential (primary) hypertension: Secondary | ICD-10-CM | POA: Insufficient documentation

## 2017-01-20 DIAGNOSIS — F419 Anxiety disorder, unspecified: Secondary | ICD-10-CM | POA: Insufficient documentation

## 2017-01-20 DIAGNOSIS — Z79899 Other long term (current) drug therapy: Secondary | ICD-10-CM | POA: Insufficient documentation

## 2017-01-20 LAB — COMPREHENSIVE METABOLIC PANEL
ALT: 20 U/L (ref 17–63)
AST: 21 U/L (ref 15–41)
Albumin: 4.4 g/dL (ref 3.5–5.0)
Alkaline Phosphatase: 67 U/L (ref 38–126)
Anion gap: 9 (ref 5–15)
BUN: 10 mg/dL (ref 6–20)
CO2: 26 mmol/L (ref 22–32)
Calcium: 9.2 mg/dL (ref 8.9–10.3)
Chloride: 101 mmol/L (ref 101–111)
Creatinine, Ser: 0.93 mg/dL (ref 0.61–1.24)
GFR calc Af Amer: >60 mL/min (ref >60)
GFR calc non Af Amer: >60 mL/min (ref >60)
Glucose, Bld: 113 mg/dL — ABNORMAL HIGH (ref 65–99)
Potassium: 2.9 mmol/L — ABNORMAL LOW (ref 3.5–5.1)
Sodium: 136 mmol/L (ref 135–145)
Total Bilirubin: 0.3 mg/dL (ref 0.3–1.2)
Total Protein: 7.7 g/dL (ref 6.5–8.1)

## 2017-01-20 LAB — CBC WITH DIFFERENTIAL/PLATELET
Basophils Absolute: 0 10*3/uL (ref 0.0–0.1)
Basophils Relative: 0 %
Eosinophils Absolute: 0.1 10*3/uL (ref 0.0–0.7)
Eosinophils Relative: 0 %
HCT: 37.1 % — ABNORMAL LOW (ref 39.0–52.0)
Hemoglobin: 12.7 g/dL — ABNORMAL LOW (ref 13.0–17.0)
Lymphocytes Relative: 13 %
Lymphs Abs: 1.9 10*3/uL (ref 0.7–4.0)
MCH: 29.5 pg (ref 26.0–34.0)
MCHC: 34.2 g/dL (ref 30.0–36.0)
MCV: 86.1 fL (ref 78.0–100.0)
Monocytes Absolute: 1.8 10*3/uL — ABNORMAL HIGH (ref 0.1–1.0)
Monocytes Relative: 12 %
Neutro Abs: 11.1 10*3/uL — ABNORMAL HIGH (ref 1.7–7.7)
Neutrophils Relative %: 75 %
Platelets: 205 10*3/uL (ref 150–400)
RBC: 4.31 MIL/uL (ref 4.22–5.81)
RDW: 13 % (ref 11.5–15.5)
WBC: 14.8 10*3/uL — ABNORMAL HIGH (ref 4.0–10.5)

## 2017-01-20 LAB — RAPID URINE DRUG SCREEN, HOSP PERFORMED
Amphetamines: NOT DETECTED
Barbiturates: NOT DETECTED
Benzodiazepines: NOT DETECTED
Cocaine: NOT DETECTED
Opiates: NOT DETECTED
Tetrahydrocannabinol: NOT DETECTED

## 2017-01-20 LAB — ETHANOL: Alcohol, Ethyl (B): <10 mg/dL (ref <10)

## 2017-01-20 LAB — CBG MONITORING, ED: Glucose-Capillary: 107 mg/dL — ABNORMAL HIGH (ref 65–99)

## 2017-01-20 MED ORDER — SODIUM CHLORIDE 0.9 % IV BOLUS (SEPSIS)
2000.0000 mL | Freq: Once | INTRAVENOUS | Status: AC
  Administered 2017-01-20: 2000 mL via INTRAVENOUS

## 2017-01-20 MED ORDER — POTASSIUM CHLORIDE CRYS ER 20 MEQ PO TBCR
40.0000 meq | EXTENDED_RELEASE_TABLET | Freq: Once | ORAL | Status: AC
  Administered 2017-01-20: 40 meq via ORAL
  Filled 2017-01-20: qty 2

## 2017-01-20 NOTE — ED Provider Notes (Signed)
[facility]-EMERGENCY DEPT Provider Note   CSN: [id number] Arrival date & time: 01/20/17  2043     History   Chief Complaint Chief Complaint  Patient presents with  . Palpitations    HPI Vincent Ibarra is a 26 y.o. male .with a hx of hypertension presents to the Emergency Department complaining of gradual, persistent, now resolved palpitations onset around 8:30 PM.  Patient reports that he took his high blood pressure medicine lisinopril 20 mg-HCTZ 12.5 mg.  He reports that this did not help and he continued taking tablets approximately every 10 minutes.  Patient reports EMS was called and he was found to be hypertensive with a blood pressure of 220/140.  Patient reports he did not want to come via EMS.  He reports that he took an additional 5 tablets of the lisinopril-HCTZ totaling 15 tablets throughout a 1.5-hour span.  Patient reports he is very anxious about his blood pressure and heart rate.  He reports this happened after he ate at [employer] and drink a large sweet tea.  He denies other caffeine usage.  He denies cocaine usage.  Patient reports that he intermittently takes his blood pressure medications but he has never had persistent tachycardia like this.  He reports that at the time he did have some chest pressure and shortness of breath.  He denies lightheadedness, nausea or diaphoresis.  Patient reports that all of his symptoms aside from the anxiety have resolved.  He denies weakness, numbness, tingling, vision changes, gait disturbance, speech disturbance.  He denies a recent URI or other infectious symptoms.  Pt denies SI, HI AVH.        The history is provided by the patient and medical records. No language interpreter was used.    Past Medical History:  Diagnosis Date  . Hypertension   . Mononucleosis     There are no active problems to display for this patient.   History reviewed. No pertinent surgical history.     Home Medications     Prior to Admission medications   Medication Sig Start Date End Date Taking? Authorizing Provider  amLODipine (NORVASC) 10 MG tablet Take 10 mg by mouth daily.    [provider]  omeprazole (PRILOSEC) 20 MG capsule Take 1 capsule (20 mg total) by mouth daily. Patient not taking: Reported on 01/20/2017 09/04/16   Eyvonne MechanicHedges, Jeffrey, PA-C    Family History Family History  Problem Relation Age of Onset  . Hypertension Other   . Diabetes Other     Social History Social History   Tobacco Use  . Smoking status: Never Smoker  . Smokeless tobacco: Never Used  Substance Use Topics  . Alcohol use: No  . Drug use: Yes    Types: Marijuana     Allergies   Patient has no known allergies.   Review of Systems Review of Systems  Constitutional: Negative for appetite change, diaphoresis, fatigue, fever and unexpected weight change.  HENT: Negative for mouth sores.   Eyes: Negative for visual disturbance.  Respiratory: Positive for shortness of breath. Negative for cough, chest tightness and wheezing.   Cardiovascular: Positive for chest pain and palpitations.  Gastrointestinal: Negative for abdominal pain, constipation, diarrhea, nausea and vomiting.  Endocrine: Negative for polydipsia, polyphagia and polyuria.  Genitourinary: Negative for dysuria, frequency, hematuria and urgency.  Musculoskeletal: Negative for back pain and neck stiffness.  Skin: Negative for rash.  Allergic/Immunologic: Negative for immunocompromised state.  Neurological: Negative for syncope, light-headedness and headaches.  Hematological: Does not bruise/bleed easily.  Psychiatric/Behavioral: Negative for sleep disturbance. The patient is not nervous/anxious.      Physical Exam Updated Vital Signs BP (!) 154/77   Pulse 65   Temp 97.9 F (36.6 C) (Oral)   Resp 14   Ht 6' (1.829 m)   Wt 83.9 kg (185 lb)   SpO2 98%   BMI 25.09 kg/m   Physical Exam  Constitutional: He appears well-developed and  well-nourished. No distress.  Awake, alert, nontoxic appearance Manual blood pressure: 162/70  HENT:  Head: Normocephalic and atraumatic.  Mouth/Throat: Oropharynx is clear and moist. No oropharyngeal exudate.  Eyes: Conjunctivae are normal. No scleral icterus.  Neck: Normal range of motion. Neck supple.  Cardiovascular: Normal rate, regular rhythm and intact distal pulses.  Pulmonary/Chest: Effort normal and breath sounds normal. No respiratory distress. He has no wheezes.  Equal chest expansion  Abdominal: Soft. Bowel sounds are normal. He exhibits no mass. There is no tenderness. There is no rebound and no guarding.  Musculoskeletal: Normal range of motion. He exhibits no edema.  No calf tenderness No leg swelling  Neurological: He is alert.  Speech is clear and goal oriented Moves extremities without ataxia  Skin: Skin is warm and dry. He is not diaphoretic.  Psychiatric: He has a normal mood and affect.  Nursing note and vitals reviewed.    ED Treatments / Results  Labs (all labs ordered are listed, but only abnormal results are displayed) Labs Reviewed  COMPREHENSIVE METABOLIC PANEL - Abnormal; Notable for the following components:      Result Value   Potassium 2.9 (*)    Glucose, Bld 113 (*)    All other components within normal limits  CBC WITH DIFFERENTIAL/PLATELET - Abnormal; Notable for the following components:   WBC 14.8 (*)    Hemoglobin 12.7 (*)    HCT 37.1 (*)    Neutro Abs 11.1 (*)    Monocytes Absolute 1.8 (*)    All other components within normal limits  ACETAMINOPHEN LEVEL - Abnormal; Notable for the following components:   Acetaminophen (Tylenol), Serum <10 (*)    All other components within normal limits  CBG MONITORING, ED - Abnormal; Notable for the following components:   Glucose-Capillary 107 (*)    All other components within normal limits  ETHANOL  RAPID URINE DRUG SCREEN, HOSP PERFORMED  SALICYLATE LEVEL  TROPONIN I  I-STAT TROPONIN, ED    I-STAT TROPONIN, ED    EKG  EKG Interpretation  Date/Time:  Saturday January 20 2017 20:49:46 EST Ventricular Rate:  82 PR Interval:    QRS Duration: 100 QT Interval:  371 QTC Calculation: 434 R Axis:   59 Text Interpretation:  Sinus rhythm RSR' in V1 or V2, probably normal variant Probable left ventricular hypertrophy Confirmed by Raeford Razor [phone] on 01/20/2017 11:37:03 PM       Radiology Dg Chest 2 View  Result Date: 01/20/2017 CLINICAL DATA:  Short of breath with chest pain EXAM: CHEST  2 VIEW COMPARISON:  None. FINDINGS: The heart size and mediastinal contours are within normal limits. Both lungs are clear. The visualized skeletal structures are unremarkable. IMPRESSION: No active cardiopulmonary disease. Electronically Signed   By: Jasmine Pang M.D.   On: 01/20/2017 23:45    Procedures Procedures (including critical care time)  Medications Ordered in ED Medications  sodium chloride 0.9 % bolus 2,000 mL (0 mLs Intravenous Stopped 01/21/17 0123)  potassium chloride SA (K-DUR,KLOR-CON) CR tablet 40 mEq (40 mEq Oral  Given 01/20/17 2355)     Initial Impression / Assessment and Plan / ED Course  I have reviewed the triage vital signs and the nursing notes.  Pertinent labs & imaging results that were available during my care of the patient were reviewed by me and considered in my medical decision making (see chart for details).  Clinical Course as of Jan 21 402  Sat Jan 20, 2017  2307 Discussed with Poison Control.  They recommend 6 hrs obs, 4 hr tylenol and salicylate.  Monitor for excessive diuresis and fluid hydration.    [HM]    Clinical Course User Index [HM] Wentworth Edelen, Dahlia ClientHannah, [place] JerseyPA-C    Patient presents with complaints of persistent palpitations, resolved prior to arrival in the ED and accidental overdose of his lisinopril-HCTZ.  Patient is adamant that he is not suicidal and had no intention of self-harm.  4:02 AM Patient is well-appearing after  6-hour observation.  4-hour salicylate and acetaminophen are negative.  Vital signs have remained stable.  Patient is somewhat hypertensive, but significantly improved from reported blood pressure with EMS.  Mild bradycardia towards the end of patient's stay.  This is asymptomatic.  He ambulates without difficulty.  No lightheadedness, chest pain or shortness of breath.  Negative troponin, reassuring EKG.  Chest x-ray without acute abnormality.  No arrhythmia or tachycardia here in the emergency department.  No return of patient's symptoms.  Patient initially with increased urination however this has improved.  Patient given 2 L of fluid for adequate hydration.  Patient noted to be hypertensive in the emergency department.  No signs of hypertensive urgency.  Discussed with patient the need for close follow-up and management by their primary care physician.  I will not start hypertension medications today.  BP (!) 144/80   Pulse (!) 55   Temp 97.9 F (36.6 C) (Oral)   Resp 11   Ht 6' (1.829 m)   Wt 83.9 kg (185 lb)   SpO2 99%   BMI 25.09 kg/m    Final Clinical Impressions(s) / ED Diagnoses   Final diagnoses:  Palpitations  Anxiety  Essential hypertension    ED Discharge Orders    None       Mardene SayerMuthersbaugh, Boyd KerbsHannah, PA-C 01/21/17 0404    Raeford RazorKohut, Stephen, MD 01/23/17 [phone]

## 2017-01-20 NOTE — ED Notes (Signed)
[facility] 12.5[facility] , 15 tabs states watch for 6 hours, fluids if low blood pressure then dopamine and norepi.

## 2017-01-20 NOTE — ED Notes (Signed)
In xray

## 2017-01-20 NOTE — ED Notes (Signed)
Writer was advised that pt had ingested 15 tablets of Lisinopril/HCTZ 20/12.5. Poison control was notified of HCTZ in Lisinopril and advised to monitor for dyuresis and replace electrolytes also monitor for hypotension and 4hour Tylenol/salycilate level. Cyndia SkeetersHannah M. PA notified of recommendations.

## 2017-01-20 NOTE — ED Triage Notes (Addendum)
2 hours ago took lisinopril 12.5mg  15 pills for palpations states he could feel it states EMS arrived and said heart rate 120's. States the pills were from 2016 old bottle he had.

## 2017-01-21 ENCOUNTER — Other Ambulatory Visit: Payer: Self-pay

## 2017-01-21 LAB — I-STAT TROPONIN, ED: TROPONIN I, POC: 0.01 ng/mL (ref 0.00–0.08)

## 2017-01-21 LAB — TROPONIN I: Troponin I: <0.03 ng/mL (ref <0.03)

## 2017-01-21 LAB — SALICYLATE LEVEL

## 2017-01-21 LAB — ACETAMINOPHEN LEVEL: Acetaminophen (Tylenol), Serum: <10 ug/mL — ABNORMAL LOW (ref 10–30)

## 2017-01-21 NOTE — Discharge Instructions (Signed)
1. Medications: usual home medications 2. Treatment: rest, drink plenty of fluids,  3. Follow Up: Please followup with your primary doctor in 2-3 days for discussion of your diagnoses and further evaluation after today's visit; if you do not have a primary care doctor use the resource guide provided to find one; Please return to the ER for return of symptoms or new concerning symptoms

## 2017-01-21 NOTE — ED Notes (Signed)
Walked pt no problem  

## 2017-01-30 ENCOUNTER — Other Ambulatory Visit: Payer: Self-pay

## 2017-01-30 ENCOUNTER — Encounter (HOSPITAL_COMMUNITY): Payer: Self-pay | Admitting: Emergency Medicine

## 2017-01-30 ENCOUNTER — Emergency Department (HOSPITAL_COMMUNITY)
Admission: EM | Admit: 2017-01-30 | Discharge: 2017-01-30 | Disposition: A | Discharge status: Home / Self Care | Payer: Self-pay | Attending: Emergency Medicine | Admitting: Emergency Medicine

## 2017-01-30 ENCOUNTER — Emergency Department (HOSPITAL_COMMUNITY): Payer: Self-pay

## 2017-01-30 DIAGNOSIS — Z79899 Other long term (current) drug therapy: Secondary | ICD-10-CM | POA: Insufficient documentation

## 2017-01-30 DIAGNOSIS — I10 Essential (primary) hypertension: Secondary | ICD-10-CM | POA: Insufficient documentation

## 2017-01-30 DIAGNOSIS — R0789 Other chest pain: Secondary | ICD-10-CM | POA: Insufficient documentation

## 2017-01-30 LAB — CBC
HEMATOCRIT: 39.1 % (ref 39.0–52.0)
Hemoglobin: 12.9 g/dL — ABNORMAL LOW (ref 13.0–17.0)
MCH: 28.3 pg (ref 26.0–34.0)
MCHC: 33 g/dL (ref 30.0–36.0)
MCV: 85.7 fL (ref 78.0–100.0)
Platelets: 220 10*3/uL (ref 150–400)
RBC: 4.56 MIL/uL (ref 4.22–5.81)
RDW: 12.8 % (ref 11.5–15.5)
WBC: 7 10*3/uL (ref 4.0–10.5)

## 2017-01-30 LAB — I-STAT TROPONIN, ED
TROPONIN I, POC: 0.01 ng/mL (ref 0.00–0.08)
Troponin i, poc: 0 ng/mL (ref 0.00–0.08)

## 2017-01-30 LAB — BASIC METABOLIC PANEL
Anion gap: 7 (ref 5–15)
BUN: 8 mg/dL (ref 6–20)
CHLORIDE: 103 mmol/L (ref 101–111)
CO2: 27 mmol/L (ref 22–32)
Calcium: 9.2 mg/dL (ref 8.9–10.3)
Creatinine, Ser: 1 mg/dL (ref 0.61–1.24)
GFR calc Af Amer: >60 mL/min (ref >60)
GFR calc non Af Amer: >60 mL/min (ref >60)
Glucose, Bld: 95 mg/dL (ref 65–99)
POTASSIUM: 3.7 mmol/L (ref 3.5–5.1)
SODIUM: 137 mmol/L (ref 135–145)

## 2017-01-30 MED ORDER — AMLODIPINE BESYLATE 10 MG PO TABS: 10.0000 mg | ORAL_TABLET | Freq: Every day | ORAL | 0 refills | Status: DC

## 2017-01-30 NOTE — ED Notes (Signed)
Pt states he has noted on his wrist tracker that "when my heart rate gets below 50 the pain tends to get worse"

## 2017-01-30 NOTE — ED Provider Notes (Signed)
[facility] EMERGENCY DEPARTMENT Provider Note  CSN: [id number] Arrival date & time: 01/30/17 1820  Chief Complaint(s) Chest Pain  HPI Vincent Ibarra is a 26 y.o. male   The history is provided by the patient.  Chest Pain   This is a new problem. Episode onset: several weeks. Episode frequency: intermittent. The problem has been resolved. Associated with: anxiety. The pain is present in the lateral region (left). The pain is mild. The quality of the pain is described as brief and sharp. The pain does not radiate. The symptoms are aggravated by certain positions (anxiety). Pertinent negatives include no back pain, no cough, no diaphoresis, no fever, no irregular heartbeat, no leg pain, no lower extremity edema, no malaise/fatigue, no nausea and no shortness of breath. He has tried nothing for the symptoms. Risk factors include male gender.  His past medical history is significant for hypertension.  Pertinent negatives for past medical history include no CAD, no diabetes, no DVT, no hyperlipidemia, no MI and no PE.    Past Medical History Past Medical History:  Diagnosis Date  . Hypertension   . Mononucleosis    There are no active problems to display for this patient.  Home Medication(s) Prior to Admission medications   Medication Sig Start Date End Date Taking? Authorizing Provider  amLODipine (NORVASC) 10 MG tablet Take 10 mg by mouth daily.    [provider]  omeprazole (PRILOSEC) 20 MG capsule Take 1 capsule (20 mg total) by mouth daily. Patient not taking: Reported on 01/20/2017 09/04/16   Eyvonne Mechanic, PA-C                                                                                                                                    Past Surgical History History reviewed. No pertinent surgical history. Family History Family History  Problem Relation Age of Onset  . Hypertension Other   . Diabetes Other     Social History Social  History   Tobacco Use  . Smoking status: Never Smoker  . Smokeless tobacco: Never Used  Substance Use Topics  . Alcohol use: No  . Drug use: Yes    Types: Marijuana   Allergies Patient has no known allergies.  Review of Systems Review of Systems  Constitutional: Negative for diaphoresis, fever and malaise/fatigue.  Respiratory: Negative for cough and shortness of breath.   Cardiovascular: Positive for chest pain.  Gastrointestinal: Negative for nausea.  Musculoskeletal: Negative for back pain.   All other systems are reviewed and are negative for acute change except as noted in the HPI  Physical Exam Vital Signs  I have reviewed the triage vital signs BP (!) 158/89 (BP Location: Left Arm)   Pulse (!) 57   Temp 98.4 F (36.9 C) (Oral)   Resp 11   SpO2 100%   Physical Exam  Constitutional: He is oriented to person, place, and time. He appears  well-developed and well-nourished. No distress.  HENT:  Head: Normocephalic and atraumatic.  Nose: Nose normal.  Eyes: Conjunctivae and EOM are normal. Pupils are equal, round, and reactive to light. Right eye exhibits no discharge. Left eye exhibits no discharge. No scleral icterus.  Neck: Normal range of motion. Neck supple.  Cardiovascular: Normal rate and regular rhythm. Exam reveals no gallop and no friction rub.  No murmur heard. Pulmonary/Chest: Effort normal and breath sounds normal. No stridor. No respiratory distress. He has no rales.  Abdominal: Soft. He exhibits no distension. There is no tenderness.  Musculoskeletal: He exhibits no edema or tenderness.  Neurological: He is alert and oriented to person, place, and time.  Skin: Skin is warm and dry. No rash noted. He is not diaphoretic. No erythema.  Psychiatric: He has a normal mood and affect.  Vitals reviewed.   ED Results and Treatments Labs (all labs ordered are listed, but only abnormal results are displayed) Labs Reviewed  CBC - Abnormal; Notable for the  following components:      Result Value   Hemoglobin 12.9 (*)    All other components within normal limits  BASIC METABOLIC PANEL  I-STAT TROPONIN, ED  I-STAT TROPONIN, ED                                                                                                                         EKG  EKG Interpretation  Date/Time:  Tuesday January 30 2017 18:26:52 EST Ventricular Rate:  53 PR Interval:  152 QRS Duration: 88 QT Interval:  446 QTC Calculation: 418 R Axis:   75 Text Interpretation:  Sinus bradycardia RSR' or QR pattern in V1 suggests right ventricular conduction delay Left ventricular hypertrophy Abnormal ECG No significant change since last tracing Confirmed by Drema Pryardama, Rutger Salton [phone]) on 01/30/2017 9:21:14 PM      Radiology Dg Chest 2 View  Result Date: 01/30/2017 CLINICAL DATA:  Central chest pain for 10 days, getting worse trauma history hypertension EXAM: CHEST  2 VIEW COMPARISON:  01/20/2017 FINDINGS: Upper normal size of cardiac silhouette. Mediastinal contours and pulmonary vascularity normal. Lungs clear. No pleural effusion or pneumothorax. Bones unremarkable. IMPRESSION: No acute abnormalities. Electronically Signed   By: Ulyses SouthwardMark  Boles M.D.   On: 01/30/2017 19:03   Pertinent labs & imaging results that were available during my care of the patient were reviewed by me and considered in my medical decision making (see chart for details).  Medications Ordered in ED Medications - No data to display  Procedures Procedures  (including critical care time)  Medical Decision Making / ED Course I have reviewed the nursing notes for this encounter and the patient's prior records (if available in EHR or on provided paperwork).    Atypical chest pain highly inconsistent with ACS.  EKG without acute ischemic changes or evidence of pericarditis.   Initial troponin negative.  Heart score less than 4 and appropriate for delta troponin.  Presentation not classic for aortic dissection or esophageal perforation. Chest x-ray without evidence suggestive of pneumonia, pneumothorax, pneumomediastinum.  No abnormal contour of the mediastinum to suggest dissection. No evidence of acute injuries.  Low pretest probability for pulmonary embolism and patient is PERC negative.  Delta troponin negative.  The patient is safe for discharge with strict return precautions.   Final Clinical Impression(s) / ED Diagnoses Final diagnoses:  Atypical chest pain    Disposition: Discharge  Condition: Good  I have discussed the results, Dx and Tx plan with the patient who expressed understanding and agree(s) with the plan. Discharge instructions discussed at great length. The patient was given strict return precautions who verbalized understanding of the instructions. No further questions at time of discharge.    ED Discharge Orders    None       Follow Up: Primary care provider  Schedule an appointment as soon as possible for a visit  As needed     This chart was dictated using voice recognition software.  Despite best efforts to proofread,  errors can occur which can change the documentation meaning.   Nira Connardama, Jahmere Bramel Eduardo, MD 01/30/17 2205

## 2017-01-30 NOTE — ED Notes (Signed)
This RN went in to d/c patient.  Patient requesting to speak to MD.  Will update Dr. Eudelia Bunchardama

## 2017-01-30 NOTE — ED Notes (Signed)
Patient able to ambulate independently  

## 2017-01-30 NOTE — ED Notes (Signed)
Pt states he has been having "a lot of stomach rumbling" and belching a lot for the past few days.

## 2017-01-30 NOTE — ED Triage Notes (Signed)
Pt to ER for evaluation for left sided heart "straining" [facility] states that's when his chest pain began. NAD. States that his "stomach is making a lot of noises." VSS.

## 2017-02-20 ENCOUNTER — Ambulatory Visit: Payer: Self-pay | Admitting: Cardiology

## 2017-02-22 ENCOUNTER — Encounter: Payer: Self-pay | Admitting: Internal Medicine

## 2017-02-22 ENCOUNTER — Ambulatory Visit (INDEPENDENT_AMBULATORY_CARE_PROVIDER_SITE_OTHER): Payer: 59 | Admitting: Internal Medicine

## 2017-02-22 VITALS — BP 138/88 | HR 72 | Ht 73.0 in | Wt 179.6 lb

## 2017-02-22 DIAGNOSIS — R002 Palpitations: Secondary | ICD-10-CM

## 2017-02-22 DIAGNOSIS — R079 Chest pain, unspecified: Secondary | ICD-10-CM

## 2017-02-22 DIAGNOSIS — I10 Essential (primary) hypertension: Secondary | ICD-10-CM | POA: Diagnosis not present

## 2017-02-22 DIAGNOSIS — I517 Cardiomegaly: Secondary | ICD-10-CM | POA: Diagnosis not present

## 2017-02-22 MED ORDER — AMLODIPINE BESYLATE 10 MG PO TABS: 10.0000 mg | ORAL_TABLET | Freq: Every day | ORAL | 11 refills | Status: DC

## 2017-02-22 NOTE — Patient Instructions (Signed)
Medication Instructions:  Start famotidine 20 mg two times a day. You do not need a prescription for this.  Labwork: 24 hour urine collection for catecholamine and metanephrine.  Testing/Procedures: Your physician has requested that you have an echocardiogram. Echocardiography is a painless test that uses sound waves to create images of your heart. It provides your doctor with information about the size and shape of your heart and how well your heart's chambers and valves are working. This procedure takes approximately one hour. There are no restrictions for this procedure.    Follow-Up: Your physician recommends that you schedule a follow-up appointment in: 1 month with Dr End.      If you need a refill on your cardiac medications before your next appointment, please call your pharmacy.

## 2017-02-22 NOTE — Progress Notes (Signed)
New Outpatient Visit Date: 02/22/2017  Referring Provider: No referring provider defined for this encounter.  Chief Complaint: Chest pain and palpitations  HPI:  [name] is a 26 y.o. male who is being seen today for the evaluation of chest pain and palpitations. He has a history of hypertension since age 9. He presented to the emergency department on 01/30/17 with a several week history of intermittent chest pain. Discomfort seemed to be brought on by anxiety. Emergency department workup was unrevealing other than significantly elevated blood pressure and EKG demonstrating LVH. He was seen in the ED 10 days earlier for palpitations in the setting of unintentional overdose on lisinopril-HCTZ.  Beginning around November 2, [name] began having chest pain and palpitations.  The symptoms began shortly after eating at [facility], though he has done this multiple times in the past without any problems.  Of note, he had consumed alcohol and smoked marijuana earlier that day (both out of the ordinary for him).  He felt as though his heart were racing, followed by a dull pain in the left side of the chest.  The pain would wax and wane in intensity with a maximal intensity of 7/10.  Pain was present daily for several weeks, prompting to aforementioned ED visits.  In both cases, no clear etiology for his discomfort was identified and he was given a diagnosis of "panic attacks."  His last episode of palpitations and chest pain was yesterday.  The discomfort seems to be associated with drinking water or eating.  Has not had any pain or palpitations when exercising, though he notes some soreness in his chest that begins about 30 minutes after he has stopped exercising.  The chest discomfort also seems to be brought on by lying on his back and improves when he lies on his left side.  When the symptoms began in early November, [name] also had experienced abdominal pain; this has now resolved.  Mr.  Vincent Ibarra denies a history of cardiac disease and testing.  He denies shortness of breath, lightheadedness, orthopnea, PND, and edema.  He notes a diagnosis of hypertension that was made at age 89.  He is currently on amlodipine, though he does not follow with a primary care provider.  Amlodipine was recently prescribed by the emergency department.  --------------------------------------------------------------------------------------------------  Cardiovascular History & Procedures: Cardiovascular Problems:  Chest pain  Palpitations  Risk Factors:  Hypertension  Cath/PCI:  None  CV Surgery:  None  EP Procedures and Devices:  None  Non-Invasive Evaluation(s):  None  Recent CV Pertinent Labs: Lab Results  Component Value Date   K 3.7 01/30/2017   BUN 8 01/30/2017   CREATININE 1.00 01/30/2017    --------------------------------------------------------------------------------------------------  Past Medical History:  Diagnosis Date  . Hypertension   . Mononucleosis     History reviewed. No pertinent surgical history.  Current Meds  Medication Sig  . amLODipine (NORVASC) 10 MG tablet Take 1 tablet (10 mg total) by mouth daily.  . [DISCONTINUED] amLODipine (NORVASC) 10 MG tablet Take 1 tablet (10 mg total) by mouth daily.  . [DISCONTINUED] amLODipine (NORVASC) 10 MG tablet Take 1 tablet (10 mg total) by mouth daily.    Allergies: Patient has no known allergies.  Social History   Socioeconomic History  . Marital status: Single    Spouse name: Not on file  . Number of children: Not on file  . Years of education: Not on file  . Highest education level: Not on file  Social Needs  .  Financial resource strain: Not on file  . Food insecurity - worry: Not on file  . Food insecurity - inability: Not on file  . Transportation needs - medical: Not on file  . Transportation needs - non-medical: Not on file  Occupational History  . Not on file  Tobacco Use  .  Smoking status: Never Smoker  . Smokeless tobacco: Never Used  Substance and Sexual Activity  . Alcohol use: Yes    Comment: Rare  . Drug use: Yes    Types: Marijuana  . Sexual activity: Not on file  Other Topics Concern  . Not on file  Social History Narrative  . Not on file    Family History  Problem Relation Age of Onset  . Hypertension Other   . Diabetes Other   . Hypertension Mother   . Lupus Mother   . Hypertension Father   . Hypertension Sister     Review of Systems: Review of Systems  Constitutional: Negative.   HENT: Negative.   Cardiovascular: Positive for chest pain and palpitations.  Gastrointestinal: Positive for abdominal pain and diarrhea.  Genitourinary: Negative.   Musculoskeletal: Positive for back pain.  Skin: Negative.   Neurological: Negative.   Endo/Heme/Allergies: Negative.   Psychiatric/Behavioral: The patient is nervous/anxious.    --------------------------------------------------------------------------------------------------  Physical Exam: BP 138/88   Pulse 72   Ht 6\' 1"  (1.854 m)   Wt 179 lb 9.6 oz (81.5 kg)   SpO2 99%   BMI 23.70 kg/m   General: Well-developed, well-nourished man, seated comfortably in the exam room.  He is accompanied by his girlfriend. HEENT: No conjunctival pallor or scleral icterus. Moist mucous membranes. OP clear. Neck: Supple without lymphadenopathy, thyromegaly, JVD, or HJR. No carotid bruit. Lungs: Normal work of breathing. Clear to auscultation bilaterally without wheezes or crackles. Heart: Regular rate and rhythm without murmurs, rubs, or gallops. Non-displaced PMI. Abd: Bowel sounds present. Soft, NT/ND without hepatosplenomegaly Ext: No lower extremity edema. Radial, PT, and DP pulses are 2+ bilaterally Skin: Warm and dry without rash. Neuro: CNIII-XII intact. Strength and fine-touch sensation intact in upper and lower extremities bilaterally. Psych: Normal mood and affect.  EKG: Normal sinus  rhythm with LVH and nonspecific ST/T changes.  Lab Results  Component Value Date   WBC 7.0 01/30/2017   HGB 12.9 (L) 01/30/2017   HCT 39.1 01/30/2017   MCV 85.7 01/30/2017   PLT 220 01/30/2017    Lab Results  Component Value Date   NA 137 01/30/2017   K 3.7 01/30/2017   CL 103 01/30/2017   CO2 27 01/30/2017   BUN 8 01/30/2017   CREATININE 1.00 01/30/2017   GLUCOSE 95 01/30/2017   ALT 20 01/20/2017    No results found for: CHOL, HDL, LDLCALC, LDLDIRECT, TRIG, CHOLHDL   --------------------------------------------------------------------------------------------------  ASSESSMENT AND PLAN: Chest pain and palpitations Symptoms are atypical for angina.  Additionally, the patient's age and minimal cardiac risk factors (hypertension) make coronary artery disease unlikely.  Given that the discomfort often occurs when [name] lies down or after having eaten, GERD is a possibility.  I have recommended a trial of famotidine 20 mg twice daily.  Given that significant hypertension was noted during ED visit setting of his chest pain and palpitations, pheochromocytoma is a consideration.  We have therefore agreed to produce a 24-hour urine collection for metanephrines and catecholamines.  We will also obtain a transthoracic echocardiogram.  Hypertension Blood pressure is borderline elevated today but has been significantly higher in the past.  Given aforementioned symptoms, we will exclude pheochromocytoma.  If this is unrevealing, workup for further additional secondary causes of hypertension will need to be pursued, given history of hypertension since age 26.  I have advised [name] to continue taking amlodipine for the time being.  Left ventricular hypertrophy EKG shows evidence of LVH by voltage criteria.  Is not surprising given history of hypertension and age years.  However, hypertrophic cardiomyopathy is also a consideration.  We will obtain a transthoracic echocardiogram for  further assessment.  Follow-up: Return to clinic in 1 month.  Yvonne Kendallhristopher Fatin Bachicha, MD 02/23/2017 7:13 AM

## 2017-02-23 ENCOUNTER — Encounter: Payer: Self-pay | Admitting: Internal Medicine

## 2017-02-23 DIAGNOSIS — I10 Essential (primary) hypertension: Secondary | ICD-10-CM | POA: Insufficient documentation

## 2017-02-23 DIAGNOSIS — I517 Cardiomegaly: Secondary | ICD-10-CM | POA: Insufficient documentation

## 2017-02-23 DIAGNOSIS — R079 Chest pain, unspecified: Secondary | ICD-10-CM | POA: Insufficient documentation

## 2017-02-23 DIAGNOSIS — R0789 Other chest pain: Secondary | ICD-10-CM

## 2017-02-23 DIAGNOSIS — R002 Palpitations: Secondary | ICD-10-CM | POA: Insufficient documentation

## 2017-02-23 NOTE — Addendum Note (Signed)
Addended by: Weber CooksOOPER, Mavrik Bynum on: 02/23/2017 12:19 PM   Modules accepted: Orders

## 2017-02-26 ENCOUNTER — Other Ambulatory Visit (HOSPITAL_COMMUNITY): Payer: 59

## 2017-02-28 ENCOUNTER — Other Ambulatory Visit: Payer: Self-pay

## 2017-02-28 ENCOUNTER — Ambulatory Visit (HOSPITAL_COMMUNITY): Payer: 59 | Attending: Internal Medicine

## 2017-02-28 ENCOUNTER — Telehealth: Payer: Self-pay | Admitting: Internal Medicine

## 2017-02-28 DIAGNOSIS — R002 Palpitations: Secondary | ICD-10-CM

## 2017-02-28 DIAGNOSIS — R079 Chest pain, unspecified: Secondary | ICD-10-CM | POA: Diagnosis not present

## 2017-02-28 DIAGNOSIS — I10 Essential (primary) hypertension: Secondary | ICD-10-CM | POA: Insufficient documentation

## 2017-02-28 NOTE — Telephone Encounter (Signed)
New message  Pt verbalized that he is calling for the rn  For echo results

## 2017-02-28 NOTE — Telephone Encounter (Signed)
Pt asking for report of echocardiogram done today, advised I do not have final report, will follow-up with him once report has been finalized.

## 2017-03-02 ENCOUNTER — Other Ambulatory Visit: Payer: Self-pay | Admitting: Internal Medicine

## 2017-03-09 ENCOUNTER — Telehealth: Payer: Self-pay | Admitting: Internal Medicine

## 2017-03-09 NOTE — Telephone Encounter (Signed)
-----   Message from Tarri Fullerarol M Fiato, CMA sent at 03/05/2017 10:34 AM EST ----- Pt has been notified of echo results with finding of "minimal leakage of aortic valve, which is unlikely to be causing his symptoms." Pt states he has finished with the 24 hour urine. I advised him that we will let him know the results of the urine test once Dr. Okey DupreEnd has reviewed. Pt asked to s/w Dr. Okey DupreEnd in regards to his aortic valve leakage and how can he stop it. I advised pt this is minimal leakage and at this point we just would monitor leakage. I explained to the pt that Dr. Okey DupreEnd was not in the office today. Pt kept interrupting me and kept insisting on s/w Dr. Okey DupreEnd. I explained to the pt it is Christmas and I was not sure yet if Dr. Okey DupreEnd was even in town due to the holiday. I reviewed ov note from Dr. Okey DupreEnd and started going over needing to watch his diet such as [employer]. I explained to the pt watching the salt in his diet due to he has high blood pressure at a young age. Pt became very rude with me and said if I was really trying to help him I would look at the schedule ane let him know when Dr. Okey DupreEnd is in the office because he wants to talk to him before his appt about how to stop the leakage from the aortic valve. After viewing Q genda I did inform the pt that Dr. Okey DupreEnd is not back in the office until 03/16/17. Pt has appt with Dr. Okey DupreEnd 03/22/17. Pt was very rude to me. I advised pt I will let Dr. Okey DupreEnd know of our conversation and if he can call him per pt request.

## 2017-03-12 NOTE — Telephone Encounter (Signed)
I have attempted to reach [name] twice by phone to discuss his questions regarding recent echo. Both times, he did not answer. I have left voicemail, asking him to contact the office; I will also try reaching out to him later this week again.  Yvonne Kendallhristopher Bernardette Waldron, MD [facility] Pager: [phone]

## 2017-03-13 DIAGNOSIS — R0683 Snoring: Secondary | ICD-10-CM

## 2017-03-13 HISTORY — DX: Snoring: R06.83

## 2017-03-14 LAB — METANEPHRINES, URINE, 24 HOUR
METANEPH TOTAL UR: 72 ug/L
Metanephrines, 24H Ur: 72 ug/24 hr (ref 45–290)
Normetanephrine, 24H Ur: 204 ug/24 hr (ref 82–500)
Normetanephrine, Ur: 203 ug/L

## 2017-03-14 LAB — CATECHOLAMINES, FRACTIONATED, URINE, 24 HOUR
Dopamine , 24H Ur: 348 ug/24 hr (ref 0–510)
Dopamine, Rand Ur: 346 ug/L
EPINEPHRINE 24H UR: 7 ug/(24.h) (ref 0–20)
EPINEPHRINE RAND UR: 7 ug/L
NOREPINEPHRINE 24H UR: 51 ug/(24.h) (ref 0–135)
Norepinephrine, Rand Ur: 51 ug/L

## 2017-03-19 ENCOUNTER — Encounter: Payer: Self-pay | Admitting: Internal Medicine

## 2017-03-19 NOTE — Telephone Encounter (Signed)
Attempted to reach Vincent Ibarra to discuss results of echo and urine catecholamines/metanephrines.  No answer, as with previous attempts.  We will follow-up as planned in the office on 03/22/16.

## 2017-03-22 ENCOUNTER — Encounter: Payer: Self-pay | Admitting: Internal Medicine

## 2017-03-22 ENCOUNTER — Ambulatory Visit (INDEPENDENT_AMBULATORY_CARE_PROVIDER_SITE_OTHER): Payer: 59 | Admitting: Internal Medicine

## 2017-03-22 VITALS — BP 140/110 | HR 75 | Ht 73.0 in | Wt 185.8 lb

## 2017-03-22 DIAGNOSIS — I10 Essential (primary) hypertension: Secondary | ICD-10-CM | POA: Diagnosis not present

## 2017-03-22 DIAGNOSIS — I351 Nonrheumatic aortic (valve) insufficiency: Secondary | ICD-10-CM | POA: Diagnosis not present

## 2017-03-22 DIAGNOSIS — R0789 Other chest pain: Secondary | ICD-10-CM | POA: Diagnosis not present

## 2017-03-22 MED ORDER — CARVEDILOL 3.125 MG PO TABS: 3.1250 mg | ORAL_TABLET | Freq: Two times a day (BID) | ORAL | 1 refills | Status: DC

## 2017-03-22 NOTE — Patient Instructions (Addendum)
Medication Instructions:   Start Carvedilol (Coreg) 3.125 mg by mouth two times a day.  -- If you need a refill on your cardiac medications before your next appointment, please call your pharmacy. --  Labwork: None ordered  Testing/Procedures:  Coronary CTA  Please arrive at the [facility] main entrance of [facility] at ______ AM (30-45 minutes prior to test start time)  [facility] [address] [place], [place] [address] [phone]  Proceed to the [facility] Radiology Department (First Floor).  Please follow these instructions carefully (unless otherwise directed):  Hold all erectile dysfunction medications at least 48 hours prior to test.  On the Night Before the Test: . Drink plenty of water. . Do not consume any caffeinated/decaffeinated beverages or chocolate 12 hours prior to your test. . Do not take any antihistamines 12 hours prior to your test. .    On the Day of the Test: . Drink plenty of water. Do not drink any water within one hour of the test. . Do not eat any food 4 hours prior to the test. . You may take your regular medications prior to the test.    Be sure to take your Carvedilol ( Coreg ) the morning of the test.  IAfter the Test: . Drink plenty of water. . After receiving IV contrast, you may experience a mild flushed feeling. This is normal. . On occasion, you may experience a mild rash up to 24 hours after the test. This is not dangerous. If this occurs, you can take Benadryl 25 mg and increase your fluid intake. . If you experience trouble breathing, this can be serious. If it is severe call 911 IMMEDIATELY. If it is mild, please call our office. . If you take any of these medications: Glipizide/Metformin, Avandament, Glucavance, please do not take 48 hours after completing test.  Follow-Up: Your physician wants you to follow-up in: 3 months with Dr. Okey DupreEnd.   You will receive a reminder letter in the mail two months in  advance. If you don't receive a letter, please call our office to schedule the follow-up appointment.  Thank you for choosing [facility]!!     Any Other Special Instructions Will Be Listed Below (If Applicable).

## 2017-03-22 NOTE — Progress Notes (Signed)
Follow-up Outpatient Visit Date: 03/22/2017  Primary Care Provider: System, Pcp Not In No address on file  Chief Complaint: Chest pain and elevated blood pressure  HPI:  Vincent Ibarra is a 27 y.o. year-old male with history of hypertension, who presents for follow-up of chest pain and palpitations. I met Vincent Ibarra a month ago after preceding ED visits for elevated blood pressure and atypical chest pain. We obtained a transthoracic echocardiogram and 24 hour urine fractionated catecholamine/metanephrines; both studies were unrevealing with the exception of trivial aortic regurgitation noted by echo.  Today, Vincent Ibarra reports feeling about the same as at our last visit. He did not notice any improvement in his chest and abdominal pain with famotidine, which he took for about 3 weeks. He recently began taking an iron supplement, as he believes that his iron was low at the time of his ED visits in November. Since beginning ferrous sulfate 325 mg daily, his chest pain has almost completely resolved. His abdominal pain has now migrated from the epigastrium to the left lower quadrant. Both chest and abdominal pain are intermittent. Chest pain is sharp and lasts a few seconds at a time. The pain can happen at any time, though felt it was more pronounced while playing basketball earlier this week. He also noted that his heart rate remained elevated for ~1 hour after playing basketball.  Vincent Ibarra reports that his BP at home is typically 964-383 systolic. He has been taking amlodipine as prescribed. He felt a little lightheaded in the lobby today but otherwise denies lightheadedness, as well as palpitations. He was told by his girlfriend that he "whines" in his sleep. He wonders if this could be related to his chest/abdominal pain or elevated blood pressure.  --------------------------------------------------------------------------------------------------  Cardiovascular History &  Procedures: Cardiovascular Problems:  Chest pain  Palpitations  Trivial aortic regurgitation  Risk Factors:  Hypertension and male gender  Cath/PCI:  None  CV Surgery:  None  EP Procedures and Devices:  None  Non-Invasive Evaluation(s):  Transthoracic echocardiogram (02/28/17): Normal LV size and wall thickness. LVEF 60-65% with normal wall motion. Normal diastolic function. Trivial aortic regurgitation. Normal RV size and function. Normal CVP.  Recent CV Pertinent Labs: Lab Results  Component Value Date   K 3.7 01/30/2017   BUN 8 01/30/2017   CREATININE 1.00 01/30/2017    Past medical and surgical history were reviewed and updated in EPIC.  Current Meds  Medication Sig  . amLODipine (NORVASC) 10 MG tablet Take 1 tablet (10 mg total) by mouth daily.  . famotidine (PEPCID) 20 MG tablet Take 1 tablet (20 mg total) by mouth 2 (two) times daily.    Allergies: Patient has no known allergies.  Social History   Socioeconomic History  . Marital status: Single    Spouse name: Not on file  . Number of children: Not on file  . Years of education: Not on file  . Highest education level: Not on file  Social Needs  . Financial resource strain: Not on file  . Food insecurity - worry: Not on file  . Food insecurity - inability: Not on file  . Transportation needs - medical: Not on file  . Transportation needs - non-medical: Not on file  Occupational History  . Not on file  Tobacco Use  . Smoking status: Never Smoker  . Smokeless tobacco: Never Used  Substance and Sexual Activity  . Alcohol use: Yes    Comment: Rare  . Drug use: Yes  Types: Marijuana  . Sexual activity: Not on file  Other Topics Concern  . Not on file  Social History Narrative  . Not on file    Family History  Problem Relation Age of Onset  . Hypertension Other   . Diabetes Other   . Hypertension Mother   . Lupus Mother   . Hypertension Father   . Hypertension Sister     Review  of Systems: A 12-system review of systems was performed and was negative except as noted in the HPI.  --------------------------------------------------------------------------------------------------  Physical Exam: BP (!) 140/110   Pulse 75   Ht '6\' 1"'  (1.854 m)   Wt 185 lb 12.8 oz (84.3 kg)   SpO2 98%   BMI 24.51 kg/m   General:  Well-developed, well-nourished young man, seated comfortably in the exam room. HEENT: No conjunctival pallor or scleral icterus. Moist mucous membranes.  OP clear. Neck: Supple without lymphadenopathy, thyromegaly, JVD, or HJR. No carotid bruit. Lungs: Normal work of breathing. Clear to auscultation bilaterally without wheezes or crackles. Heart: Regular rate and rhythm without murmurs, rubs, or gallops. Non-displaced PMI. Abd: Bowel sounds present. Soft, NT/ND without hepatosplenomegaly Ext: No lower extremity edema. Radial, PT, and DP pulses are 2+ bilaterally. Skin: Warm and dry without rash.  Lab Results  Component Value Date   WBC 7.0 01/30/2017   HGB 12.9 (L) 01/30/2017   HCT 39.1 01/30/2017   MCV 85.7 01/30/2017   PLT 220 01/30/2017    Lab Results  Component Value Date   NA 137 01/30/2017   K 3.7 01/30/2017   CL 103 01/30/2017   CO2 27 01/30/2017   BUN 8 01/30/2017   CREATININE 1.00 01/30/2017   GLUCOSE 95 01/30/2017   ALT 20 01/20/2017    No results found for: CHOL, HDL, LDLCALC, LDLDIRECT, TRIG, CHOLHDL  --------------------------------------------------------------------------------------------------  ASSESSMENT AND PLAN: Atypical chest pain Pain is very short and sharp, though it seems to be a little more frequent with activity. Interestingly, it has improved with iron supplementation (though patient was only minimally anemic when checked in the ED in November). Echo did not show any abnormalities to explain his chest pain. Given persistence, we have discussed further evaluation options including stress testing, cardiac CT, and  catheterization. I have a low suspicion for atherosclerotic CAD. We have agreed to obtain a coronary CTA, which will allow [place] to rule out an anomalous coronary artery as well as coarcartion of the aorta, given concern for secondary hypertension. If the CTA is negative, Vincent Ibarra may benefit from GI evaluation. We will also refer him to a PCP to discuss his mild anemia.  Hypertension BP is elevated again today, particularly the diastolic reading. Work-up for pheochromocytoma was negative. Given upcoming CTA of the chest, chest pain, and occasional palpitations, we have agreed to add carvedilol 3.125 mg BID. I will continue amlodipine 10 mg daily. If CTA chest is negative for coarctation of the aorta, we will need to consider testing for hyperaldosteronism and renovascular hypertension.  Aortic regurgitation Trivial AI noted by echo. We discussed the importance of blood pressure control. Though echo did not show evidence of aortic root enlargement, aforementioned CTA will allow [place] to exclude aortopathy though could predispose to aortic regurgitation.  Follow-up: Return to clinic in 3 months.  Nelva Bush, MD 03/22/2017 11:36 AM

## 2017-03-23 ENCOUNTER — Encounter: Payer: Self-pay | Admitting: Internal Medicine

## 2017-03-23 DIAGNOSIS — I351 Nonrheumatic aortic (valve) insufficiency: Secondary | ICD-10-CM | POA: Insufficient documentation

## 2017-03-28 DIAGNOSIS — J342 Deviated nasal septum: Secondary | ICD-10-CM | POA: Insufficient documentation

## 2017-03-28 DIAGNOSIS — M26623 Arthralgia of bilateral temporomandibular joint: Secondary | ICD-10-CM | POA: Insufficient documentation

## 2017-03-28 DIAGNOSIS — J343 Hypertrophy of nasal turbinates: Secondary | ICD-10-CM | POA: Insufficient documentation

## 2017-03-28 DIAGNOSIS — J310 Chronic rhinitis: Secondary | ICD-10-CM | POA: Insufficient documentation

## 2017-03-30 ENCOUNTER — Encounter (HOSPITAL_COMMUNITY): Payer: Self-pay | Admitting: Family Medicine

## 2017-03-30 ENCOUNTER — Ambulatory Visit (HOSPITAL_COMMUNITY)
Admission: EM | Admit: 2017-03-30 | Discharge: 2017-03-30 | Disposition: A | Discharge status: Home / Self Care | Payer: 59 | Attending: Emergency Medicine | Admitting: Emergency Medicine

## 2017-03-30 DIAGNOSIS — J029 Acute pharyngitis, unspecified: Secondary | ICD-10-CM | POA: Diagnosis not present

## 2017-03-30 DIAGNOSIS — L918 Other hypertrophic disorders of the skin: Secondary | ICD-10-CM

## 2017-03-30 LAB — POCT RAPID STREP A: STREPTOCOCCUS, GROUP A SCREEN (DIRECT): NEGATIVE

## 2017-03-30 NOTE — ED Triage Notes (Signed)
Pt here for sore throat and white patches on throat.

## 2017-03-30 NOTE — ED Provider Notes (Signed)
[facility]    CSN: [id number] Arrival date & time: 03/30/17  1858     History   Chief Complaint Chief Complaint  Patient presents with  . Sore Throat    HPI Vincent Ibarra is a 27 y.o. male.   Vincent Ibarra presents with complaints of sore throat which has been present for approximately 1.5 week. It has not worsened but has not improved. Without fevers, cough, runny nose or known ill contacts. Saw ENT two days ago and was prescribed flonase which he has not started yet. Has not taken any medications for his symptoms. Has had mono in the past. Has not tried any medications for his symptoms, felt like he saw white patches today. Also concerned about a mole to his pelvic which is itchy. He states it has been present for some time, has an appointment with dermatology next week for this, he states he is concerned about melanoma. It has not changed in size. He states it is where his belt hits therefore it gets irritated.    ROS per HPI.       Past Medical History:  Diagnosis Date  . Hypertension   . Mononucleosis     Patient Active Problem List   Diagnosis Date Noted  . Aortic valve regurgitation 03/23/2017  . Atypical chest pain 02/23/2017  . Palpitations 02/23/2017  . Hypertension 02/23/2017  . Left ventricular hypertrophy 02/23/2017    History reviewed. No pertinent surgical history.     Home Medications    Prior to Admission medications   Medication Sig Start Date End Date Taking? Authorizing Provider  amLODipine (NORVASC) 10 MG tablet Take 1 tablet (10 mg total) by mouth daily. 02/22/17 03/24/17  End, Cristal Deer, MD  carvedilol (COREG) 3.125 MG tablet Take 1 tablet (3.125 mg total) by mouth 2 (two) times daily. 03/22/17 06/20/17  End, Cristal Deer, MD  famotidine (PEPCID) 20 MG tablet Take 1 tablet (20 mg total) by mouth 2 (two) times daily. 02/22/17   End, Cristal Deer, MD    Family History Family History  Problem Relation Age of Onset  .  Hypertension Other   . Diabetes Other   . Hypertension Mother   . Lupus Mother   . Hypertension Father   . Hypertension Sister     Social History Social History   Tobacco Use  . Smoking status: Never Smoker  . Smokeless tobacco: Never Used  Substance Use Topics  . Alcohol use: Yes    Comment: Rare  . Drug use: Yes    Types: Marijuana     Allergies   Patient has no known allergies.   Review of Systems Review of Systems   Physical Exam Triage Vital Signs ED Triage Vitals  Enc Vitals Group     BP 03/30/17 1944 135/63     Pulse Rate 03/30/17 1944 (!) 52     Resp 03/30/17 1944 18     Temp 03/30/17 1944 98.6 F (37 C)     Temp Source 03/30/17 1944 Oral     SpO2 03/30/17 1944 100 %     Weight --      Height --      Head Circumference --      Peak Flow --      Pain Score 03/30/17 1943 4     Pain Loc --      Pain Edu? --      Excl. in GC? --    No data found.  Updated Vital Signs BP 135/63  Pulse (!) 52   Temp 98.6 F (37 C) (Oral)   Resp 18   SpO2 100%   Visual Acuity Right Eye Distance:   Left Eye Distance:   Bilateral Distance:    Right Eye Near:   Left Eye Near:    Bilateral Near:     Physical Exam  Constitutional: He is oriented to person, place, and time. He appears well-developed and well-nourished.  HENT:  Head: Normocephalic and atraumatic.  Right Ear: Tympanic membrane, external ear and ear canal normal.  Left Ear: Tympanic membrane, external ear and ear canal normal.  Nose: Nose normal. Right sinus exhibits no maxillary sinus tenderness and no frontal sinus tenderness. Left sinus exhibits no maxillary sinus tenderness and no frontal sinus tenderness.  Mouth/Throat: Uvula is midline, oropharynx is clear and moist and mucous membranes are normal.  Eyes: Conjunctivae are normal. Pupils are equal, round, and reactive to light.  Neck: Normal range of motion.  Cardiovascular: Normal rate and regular rhythm.  Pulmonary/Chest: Effort normal  and breath sounds normal.  Lymphadenopathy:    He has no cervical adenopathy.  Neurological: He is alert and oriented to person, place, and time.  Skin: Skin is warm and dry.     Skin toned mobile skin tag noted; non tender, without redness, drainage   Vitals reviewed.    UC Treatments / Results  Labs (all labs ordered are listed, but only abnormal results are displayed) Labs Reviewed  CULTURE, GROUP A STREP [facility])  POCT RAPID STREP A    EKG  EKG Interpretation None       Radiology No results found.  Procedures Procedures (including critical care time)  Medications Ordered in UC Medications - No data to display   Initial Impression / Assessment and Plan / UC Course  I have reviewed the triage vital signs and the nursing notes.  Pertinent labs & imaging results that were available during my care of the patient were reviewed by me and considered in my medical decision making (see chart for details).     Benign physical findings. Non toxic in appearance. Patient appears quite anxious about health. Recommended to follow ENT recommendations and start daily flonase. Continue with twice a day pepcid. Keep appointment with dermatology as likely will be able to easily remove skin tag for comfort. Continue with supportive cares for symptoms . Patient verbalized understanding and agreeable to plan.    Final Clinical Impressions(s) / UC Diagnoses   Final diagnoses:  Pharyngitis, unspecified etiology  Skin tag    ED Discharge Orders    None       Controlled Substance Prescriptions [place] Controlled Substance Registry consulted? Not Applicable   Georgetta HaberBurky, Natalie B, NP 03/30/17 2053

## 2017-03-30 NOTE — Discharge Instructions (Signed)
Please start the flonase which was previously prescribed by ENT.  Your strep swab was negative today, will call you if culture returns positive. Antihistamine may also be helpful. Keep your appointment with dermatology as they may likely be able to easily remove the skin tag which is bothering you.

## 2017-04-02 LAB — CULTURE, GROUP A STREP (THRC)

## 2017-04-08 ENCOUNTER — Encounter (HOSPITAL_COMMUNITY): Payer: Self-pay | Admitting: Emergency Medicine

## 2017-04-08 ENCOUNTER — Emergency Department (HOSPITAL_COMMUNITY)
Admission: EM | Admit: 2017-04-08 | Discharge: 2017-04-08 | Disposition: A | Discharge status: Home / Self Care | Payer: 59 | Attending: Emergency Medicine | Admitting: Emergency Medicine

## 2017-04-08 DIAGNOSIS — J069 Acute upper respiratory infection, unspecified: Secondary | ICD-10-CM | POA: Insufficient documentation

## 2017-04-08 DIAGNOSIS — Z79899 Other long term (current) drug therapy: Secondary | ICD-10-CM | POA: Diagnosis not present

## 2017-04-08 DIAGNOSIS — I10 Essential (primary) hypertension: Secondary | ICD-10-CM | POA: Diagnosis not present

## 2017-04-08 DIAGNOSIS — H01004 Unspecified blepharitis left upper eyelid: Secondary | ICD-10-CM | POA: Diagnosis not present

## 2017-04-08 DIAGNOSIS — J029 Acute pharyngitis, unspecified: Secondary | ICD-10-CM | POA: Diagnosis present

## 2017-04-08 MED ORDER — BACITRACIN-POLYMYXIN B 500-10000 UNIT/GM OP OINT: 1.0000 "application " | TOPICAL_OINTMENT | Freq: Two times a day (BID) | OPHTHALMIC | 0 refills | Status: DC

## 2017-04-08 NOTE — ED Notes (Signed)
Patient refusing rapid step test

## 2017-04-08 NOTE — ED Triage Notes (Addendum)
Patient here from home with complaints of sore throat and cough x 2 days. Hx of same. Also reports left eye drainage and redness. Denies nausea/vomiting.

## 2017-04-08 NOTE — Discharge Instructions (Signed)
As discussed, gently cleanse your eyelid with mild baby shampoo to remove any of the crust.  Apply ophthalmic ointment twice a day. Follow up with your primary care provider.  Return sooner if symptoms worsen, pain your eye, pain with eye movement, visual changes, headache, nausea vomiting or other new concerning symptoms in the meantime.

## 2017-04-08 NOTE — ED Provider Notes (Signed)
[facility]-EMERGENCY DEPT Provider Note   CSN: [id number] Arrival date & time: 04/08/17  1453     History   Chief Complaint Chief Complaint  Patient presents with  . Eye Problem  . Sore Throat    HPI Vincent Ibarra is a 27 y.o. male presenting with left scleral injection and crusting at the eyelashes which started 4 days ago and was pruritic.  States that it has been the same since onset.  He denies any change in vision or pain in the eye, sensation of foreign body, not a contact lens wearer.  He also reported being scratched by cat 3 weeks ago when looking up symptoms on clinical and stating that his lymph nodes are swollen in his neck and armpits.  Patient has been having a sore throat with a negative rapid strep at urgent care and has been seen by ENT, no new throat symptoms.  Stating that he started having a runny nose yesterday.  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  Denies fever, chills, cough, congestion. He has tried eyedrops without relief.  HPI  Past Medical History:  Diagnosis Date  . Hypertension   . Mononucleosis     Patient Active Problem List   Diagnosis Date Noted  . Aortic valve regurgitation 03/23/2017  . Atypical chest pain 02/23/2017  . Palpitations 02/23/2017  . Hypertension 02/23/2017  . Left ventricular hypertrophy 02/23/2017    History reviewed. No pertinent surgical history.     Home Medications    Prior to Admission medications   Medication Sig Start Date End Date Taking? Authorizing Provider  amLODipine (NORVASC) 10 MG tablet Take 1 tablet (10 mg total) by mouth daily. 02/22/17 03/24/17  End, Cristal Deer, MD  bacitracin-polymyxin b (POLYSPORIN) ophthalmic ointment Place 1 application into the left eye every 12 (twelve) hours. apply to eye every 12 hours while awake 04/08/17   Mathews Robinsons B, PA-C  carvedilol (COREG) 3.125 MG tablet Take 1 tablet (3.125 mg total) by mouth 2 (two) times  daily. 03/22/17 06/20/17  End, Cristal Deer, MD  famotidine (PEPCID) 20 MG tablet Take 1 tablet (20 mg total) by mouth 2 (two) times daily. 02/22/17   End, Cristal Deer, MD    Family History Family History  Problem Relation Age of Onset  . Hypertension Other   . Diabetes Other   . Hypertension Mother   . Lupus Mother   . Hypertension Father   . Hypertension Sister     Social History Social History   Tobacco Use  . Smoking status: Never Smoker  . Smokeless tobacco: Never Used  Substance Use Topics  . Alcohol use: Yes    Comment: Rare  . Drug use: Yes    Types: Marijuana     Allergies   Patient has no known allergies.   Review of Systems Review of Systems  Constitutional: Negative for chills and fever.  HENT: Positive for rhinorrhea. Negative for congestion, ear pain, facial swelling, sore throat, trouble swallowing and voice change.   Eyes: Positive for discharge, redness and itching. Negative for photophobia, pain and visual disturbance.       Patient denies any thick purulent discharge.  He states that when he puts a tissue thiazide there is little bit of serous colored fluid on the tissue he has been having crusting around his eyelashes   Respiratory: Negative for cough, shortness of breath, wheezing and stridor.   Cardiovascular: Negative for chest pain and palpitations.  Gastrointestinal: Negative for  abdominal pain, nausea and vomiting.  Genitourinary: Negative for dysuria and hematuria.  Musculoskeletal: Negative for arthralgias, back pain, myalgias, neck pain and neck stiffness.  Skin: Negative for color change, pallor and rash.  Neurological: Negative for dizziness, seizures, syncope, light-headedness and headaches.     Physical Exam Updated Vital Signs BP 139/71 (BP Location: Right Arm)   Pulse 72   Temp 98 F (36.7 C) (Oral)   Resp 14   Ht 6\' 1"  (1.854 m)   Wt 81.6 kg (180 lb)   SpO2 100%   BMI 23.75 kg/m   Physical Exam  Constitutional: He is  oriented to person, place, and time. He appears well-developed and well-nourished.  Non-toxic appearance. He does not appear ill. No distress.  Afebrile, nontoxic-appearing, sitting comfortably in chair in no acute distress.  HENT:  Head: Normocephalic and atraumatic.  Right Ear: External ear normal.  Left Ear: External ear normal.  Mouth/Throat: Oropharynx is clear and moist. No oropharyngeal exudate.  Normal tympanic membranes bilaterally  Eyes: Conjunctivae and EOM are normal. Pupils are equal, round, and reactive to light. Right eye exhibits no discharge. Left eye exhibits no discharge.  Blepharitis of the left eye with injected sclera and conjunctiva. No pain with extraocular motion.  No foreign body  Neck: Normal range of motion. Neck supple.  Cardiovascular: Normal rate, regular rhythm and normal heart sounds.  No murmur heard. Pulmonary/Chest: Effort normal and breath sounds normal. No stridor. No respiratory distress. He has no wheezes. He has no rales.  Musculoskeletal: Normal range of motion. He exhibits no edema.  No axillary lymphadenopathy  Lymphadenopathy:    He has no cervical adenopathy.  Neurological: He is alert and oriented to person, place, and time.  Skin: Skin is warm and dry. No rash noted. He is not diaphoretic. No erythema. No pallor.  Psychiatric: He has a normal mood and affect.  Nursing note and vitals reviewed.    ED Treatments / Results  Labs (all labs ordered are listed, but only abnormal results are displayed) Labs Reviewed - No data to display  EKG  EKG Interpretation None       Radiology No results found.  Procedures Procedures (including critical care time)  Medications Ordered in ED Medications - No data to display   Initial Impression / Assessment and Plan / ED Course  I have reviewed the triage vital signs and the nursing notes.  Pertinent labs & imaging results that were available during my care of the patient were reviewed by  me and considered in my medical decision making (see chart for details).    Patient presenting with left blepharitis.  Patient appears to experience a significant amount of health related anxiety.  Sclera and conjunctiva are injected.  Is reporting pruritus and scratching his eye.  No visual changes, no pain in the eye.  He has been looking up symptoms on google and thinks that he has cancer in his throat and lymphadenopathy in his neck and axillary.  Patient has been seen by ENT with negative workup and has not been following their recommendations per urgent care note.  Attempted to reassure patient and discourage searching symptoms online.  On my exam, there is no cervical adenopathy or axillary adenopathy.  Advised patient to apply warm compresses and gently cleanse the area with baby shampoo.  Will discharge home with bacitracin ointment and close follow-up with PCP. Discussed return precautions the patient agrees with discharge plan.  Final Clinical Impressions(s) / ED Diagnoses  Final diagnoses:  Blepharitis of left upper eyelid, unspecified type  Viral upper respiratory tract infection    ED Discharge Orders        Ordered    bacitracin-polymyxin b (POLYSPORIN) ophthalmic ointment  Every 12 hours     04/08/17 1816       Gregary CromerMitchell, Joshalyn Ancheta B, PA-C 04/08/17 2252    Raeford RazorKohut, Stephen, MD 04/08/17 2252

## 2017-04-23 ENCOUNTER — Encounter: Payer: Self-pay | Admitting: Family Medicine

## 2017-04-23 ENCOUNTER — Ambulatory Visit (INDEPENDENT_AMBULATORY_CARE_PROVIDER_SITE_OTHER): Payer: 59 | Admitting: Family Medicine

## 2017-04-23 VITALS — BP 120/72 | HR 56 | Temp 98.5°F | Ht 73.0 in | Wt 177.8 lb

## 2017-04-23 DIAGNOSIS — R232 Flushing: Secondary | ICD-10-CM

## 2017-04-23 DIAGNOSIS — D649 Anemia, unspecified: Secondary | ICD-10-CM | POA: Diagnosis not present

## 2017-04-23 DIAGNOSIS — R0683 Snoring: Secondary | ICD-10-CM

## 2017-04-23 DIAGNOSIS — I351 Nonrheumatic aortic (valve) insufficiency: Secondary | ICD-10-CM | POA: Diagnosis not present

## 2017-04-23 DIAGNOSIS — R0681 Apnea, not elsewhere classified: Secondary | ICD-10-CM | POA: Diagnosis not present

## 2017-04-23 DIAGNOSIS — R0602 Shortness of breath: Secondary | ICD-10-CM | POA: Diagnosis not present

## 2017-04-23 DIAGNOSIS — I10 Essential (primary) hypertension: Secondary | ICD-10-CM | POA: Diagnosis not present

## 2017-04-23 DIAGNOSIS — Z7689 Persons encountering health services in other specified circumstances: Secondary | ICD-10-CM

## 2017-04-23 DIAGNOSIS — R002 Palpitations: Secondary | ICD-10-CM

## 2017-04-23 LAB — T3, FREE: T3, Free: 3.8 pg/mL (ref 2.3–4.2)

## 2017-04-23 LAB — TSH: TSH: 1.26 u[IU]/mL (ref 0.35–4.50)

## 2017-04-23 LAB — T4, FREE: FREE T4: 0.88 ng/dL (ref 0.60–1.60)

## 2017-04-23 NOTE — Progress Notes (Signed)
Patient ID: Vincent Ibarra, male  DOB: Dec 13, 1990, 27 y.o.   MRN: [id number] Patient Care Team    Relationship Specialty Notifications Start End  Ma Hillock, DO PCP - General Family Medicine  04/23/17   Nelva Bush, MD PCP - Cardiology Cardiology Admissions 87/86/76   Jodi Marble, MD Consulting Physician Otolaryngology  04/23/17     Chief Complaint  Patient presents with  . Establish Care    pt c/o of breathing problem since November 2019    Subjective:  Vincent Ibarra is a 27 y.o.  male present for new patient establishment. All past medical history, surgical history, allergies, family history, immunizations, medications and social history were obtained and entered in the electronic medical record today. All recent labs, ED visits and hospitalizations within the last year were reviewed.  Breathing problems: Patient is under the care of cardiology since November when he had an episode of dyspnea and chest pain. He was found to be hypertensive, started on amlodipine 10 mg daily. A TEE and 24 hour urine fraction catecholamines/metanephrines was completed in both studies were unrevealing, with the exception of trivial aortic regurgitation. He started taking iron supplementation once daily, reports that has been helpful. He has a history of anemia since childhood. He denies sickle cell anemia. He reports he was having some rectal bleeding around the time of this emergency room visit, that has resolved. Today patient states that he was started on Coreg approximate 2 weeks ago. She reports compliance with the amlodipine and the Coreg daily and tolerating both medications well. His cardiologist has ordered a CT coronary flow tomorrow. Patient reports he has racing heart and shortness of breath approximately 35 minutes into his exercise regimen. He sits down for approximately 15 minutes and his symptoms are improved. He reports a sharp pain in his sternal area that occurs  intermittently during this time. He feels he has hot flashes throughout the day, and every night. He has difficulty sleeping secondary to hot flashes and wakes frequently. His significant other states that he snores quite a bit and she has noticed that he is gasping for air and pausing in his breath. He denies daytime somnolence. He reports a history of anemia since childhood.  Patient has made an appointment on his own at the sleep Center [facility]?) Scheduled of February 25. Of note, in reviewing electronic medical records he is under the care of otolaryngology. Which mentioned possible sarcoidosis, but no testing has been initiated as of yet.  Reviewed chest x-ray collected during ED visit November 2018 within normal limits. Reviewed echocardiogram 03/02/2017, ejection fraction 60-65%. Cavity size normal. Wall thickness normal. Trivial aortic regurgitation. CMP December 2018: Low potassium 2.9. Otherwise within normal limits. Normal liver function. CBC November 2018: Mildly low hemoglobin at 12.9. MCV 85.7. Platelets 220.  Depression screen [facility] 2/9 04/23/2017  Decreased Interest 0  Down, Depressed, Hopeless 0  PHQ - 2 Score 0   No flowsheet data found.  Current Exercise Habits: Structured exercise class, Type of exercise: strength training/weights;Other - see comments;treadmill, Frequency (Times/Week): 2, Intensity: Moderate   No flowsheet data found.    There is no immunization history on file for this patient.  No exam data present  Past Medical History:  Diagnosis Date  . Anemia    "since childhood" denies Sickle cell history  . Blood in stool   . Hypertension   . Left ventricular hypertrophy   . Mononucleosis    No Known Allergies Past Surgical  History:  Procedure Laterality Date  . NO PAST SURGERIES     Family History  Problem Relation Age of Onset  . Hypertension Other   . Diabetes Other   . Hypertension Mother   . Lupus Mother   . Hypertension Father   . Diabetes  Father   . Hypertension Sister   . Mental illness Brother    Social History   Socioeconomic History  . Marital status: Single    Spouse name: Not on file  . Number of children: Not on file  . Years of education: Not on file  . Highest education level: Not on file  Social Needs  . Financial resource strain: Not on file  . Food insecurity - worry: Not on file  . Food insecurity - inability: Not on file  . Transportation needs - medical: Not on file  . Transportation needs - non-medical: Not on file  Occupational History  . Occupation: Chief Financial Officer  Tobacco Use  . Smoking status: Never Smoker  . Smokeless tobacco: Never Used  Substance and Sexual Activity  . Alcohol use: Yes    Comment: Rare  . Drug use: Yes    Types: Marijuana  . Sexual activity: Yes    Partners: Female  Other Topics Concern  . 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    College educated .   Routine exercise.   Takes a daily vitamin.   Uses herbal remedies.   Smoke alarm in the home. Her seatbelt.   Feels safe in his relationships.   Allergies as of 04/23/2017   No Known Allergies     Medication List        Accurate as of 04/23/17  4:50 PM. Always use your most recent med list.          amLODipine 10 MG tablet Commonly known as:  NORVASC Take 1 tablet (10 mg total) by mouth daily.   carvedilol 3.125 MG tablet Commonly known as:  COREG Take 1 tablet (3.125 mg total) by mouth 2 (two) times daily.       All past medical history, surgical history, allergies, family history, immunizations andmedications were updated in the EMR today and reviewed under the history and medication portions of their EMR.     No results found.   ROS: 14 pt review of systems performed and negative (unless mentioned in an HPI)  Objective: BP 120/72 (BP Location: Left Arm, Patient Position: Sitting, Cuff Size: Normal)   Pulse (!) 56   Temp 98.5 F (36.9 C) (Oral)   Ht '6\' 1"'$  (1.854 m)    Wt 177 lb 12.8 oz (80.6 kg)   SpO2 99%   BMI 23.46 kg/m  Gen: Afebrile. No acute distress. Nontoxic in appearance, well-developed, well-nourished, African-American male.  HENT: AT. [place].  No Cough on exam, no hoarseness on exam. Eyes:Pupils Equal Round Reactive to light, Extraocular movements intact,  Conjunctiva without redness, discharge or icterus. Neck/lymp/endocrine: Supple, no lymphadenopathy, no thyromegaly CV: RRR no murmur, no edema, +2/4 P posterior tibialis pulses. No carotid bruits. No JVD. Chest: CTAB, no wheeze, rhonchi or crackles. Breath sounds mildly diminished. Normal Respiratory effort.  Skin: no rashes, purpura or petechiae. Warm and well-perfused. Skin intact. Neuro/Msk: Normal gait. PERLA. EOMi. Alert. Oriented x3.   Psych: Normal affect, dress and demeanor. Normal speech. Normal thought content and judgment.   Assessment/plan: Vincent Ibarra is a 27 y.o. male present for new patient establishment. Hypertension, unspecified type/trivial  aortic valve insufficiency/palpitations/hot flashes/anemia - Continue amlodipine 10 mg daily and Coreg twice a day. Medications provided through cardiology. VSS.  - Rule out thyroid disorder and iron deficiency. - TSH - T4, free - T3, free - ? Sarcoid workup, mother with Lupus (pt with chronic mild anemia). Consider ANA with reflex, ESR, CRP, ACE level, PFT and CT chest.      - potential nasal finding by ENT note. Cardiology sx and chest sx. - Continue follow-up with cardiology - Iron, TIBC and Ferritin Panel - CBC with Differential/Platelet - if today's labs normal, would move forward on sarcoid labs/images.   Witnessed apneic spells/snoring - Reports of snoring and frequent apneic spells with significant other. Patient has an appointment with sleep clinic February 25 (self-referred). Asked him to have those notes forwarded to [place] after appointment. - Certainly could play a role in his hypertension. However family history  is present for hypertension early onset.  Close follow-up recommended. Will await lab results to devise plan.  Greater than 45 minutes was spent with patient, greater than 50% of that time was spent face-to-face with patient counseling and coordinating care.    Note is dictated utilizing voice recognition software. Although note has been proof read prior to signing, occasional typographical errors still can be missed. If any questions arise, please do not hesitate to call for verification.  Electronically signed by: Howard Pouch, DO [place]

## 2017-04-23 NOTE — Patient Instructions (Signed)
It was nice to meet you today.  We will cal you with lab results once available.   Please have your cardiologist and sleep study lab send [place] their notes/resutls.   Sleep Apnea Sleep apnea is a condition that affects breathing. People with sleep apnea have moments during sleep when their breathing pauses briefly or gets shallow. Sleep apnea can cause these symptoms:  Trouble staying asleep.  Sleepiness or tiredness during the day.  Irritability.  Loud snoring.  Morning headaches.  Trouble concentrating.  Forgetting things.  Less interest in sex.  Being sleepy for no reason.  Mood swings.  Personality changes.  Depression.  Waking up a lot during the night to pee (urinate).  Dry mouth.  Sore throat.  Follow these instructions at home:  Make any changes in your routine that your doctor recommends.  Eat a healthy, well-balanced diet.  Take over-the-counter and prescription medicines only as told by your doctor.  Avoid using alcohol, calming medicines (sedatives), and narcotic medicines.  Take steps to lose weight if you are overweight.  If you were given a machine (device) to use while you sleep, use it only as told by your doctor.  Do not use any tobacco products, such as cigarettes, chewing tobacco, and e-cigarettes. If you need help quitting, ask your doctor.  Keep all follow-up visits as told by your doctor. This is important. Contact a doctor if:  The machine that you were given to use during sleep is uncomfortable or does not seem to be working.  Your symptoms do not get better.  Your symptoms get worse. Get help right away if:  Your chest hurts.  You have trouble breathing in enough air (shortness of breath).  You have an uncomfortable feeling in your back, arms, or stomach.  You have trouble talking.  One side of your body feels weak.  A part of your face is hanging down (drooping). These symptoms may be an emergency. Do not wait to see  if the symptoms will go away. Get medical help right away. Call your local emergency services (911 in the [place] Do not drive yourself to the hospital. This information is not intended to replace advice given to you by your health care provider. Make sure you discuss any questions you have with your health care provider. Document Released: 12/07/2007 Document Revised: 10/24/2015 Document Reviewed: 12/07/2014 Elsevier Interactive Patient Education  2018 [employer].   Iron Deficiency Anemia, Adult Iron-deficiency anemia is when you have a low amount of red blood cells or hemoglobin. This happens because you have too little iron in your body. Hemoglobin carries oxygen to parts of the body. Anemia can cause your body to not get enough oxygen. It may or may not cause symptoms. Follow these instructions at home: Medicines  Take over-the-counter and prescription medicines only as told by your doctor. This includes iron pills (supplements) and vitamins.  If you cannot handle taking iron pills by mouth, ask your doctor about getting iron through: ? A vein (intravenously). ? A shot (injection) into a muscle.  Take iron pills when your stomach is empty. If you cannot handle this, take them with food.  Do not drink milk or take antacids at the same time as your iron pills.  To prevent trouble pooping (constipation), eat fiber or take medicine (stool softener) as told by your doctor. Eating and drinking  Talk with your doctor before changing the foods you eat. He or she may tell you to eat foods that have  a lot of iron, such as: ? Liver. ? Lowfat (lean) beef. ? Breads and cereals that have iron added to them (fortified breads and cereals). ? Eggs. ? Dried fruit. ? Dark green, leafy vegetables.  Drink enough fluid to keep your pee (urine) clear or pale yellow.  Eat fresh fruits and vegetables that are high in vitamin C. They help your body to use iron. Foods with a lot of vitamin C  include: ? Oranges. ? Peppers. ? Tomatoes. ? Mangoes. General instructions  Return to your normal activities as told by your doctor. Ask your doctor what activities are safe for you.  Keep yourself clean, and keep things clean around you (your surroundings). Anemia can make you get sick more easily.  Keep all follow-up visits as told by your doctor. This is important. Contact a doctor if:  You feel sick to your stomach (nauseous).  You throw up (vomit).  You feel weak.  You are sweating for no clear reason.  You have trouble pooping, such as: ? Pooping (having a bowel movement) less than 3 times a week. ? Straining to poop. ? Having poop that is hard, dry, or larger than normal. ? Feeling full or bloated. ? Pain in the lower belly. ? Not feeling better after pooping. Get help right away if:  You pass out (faint). If this happens, do not drive yourself to the hospital. Call your local emergency services (911 in the [place]  You have chest pain.  You have shortness of breath that: ? Is very bad. ? Gets worse with physical activity.  You have a fast heartbeat.  You get light-headed when getting up from sitting or lying down. This information is not intended to replace advice given to you by your health care provider. Make sure you discuss any questions you have with your health care provider. Document Released: 04/01/2010 Document Revised: 11/17/2015 Document Reviewed: 11/17/2015 Elsevier Interactive Patient Education  2017 [employer]  Please help [place] help you:  We are honored you have chosen [facility] for your Primary Care home. Below you will find basic instructions that you may need to access in the future. Please help [place] help you by reading the instructions, which cover many of the frequent questions we experience.   Prescription refills and request:  -In order to allow more efficient response time, please call your pharmacy for all refills. They will  forward the request electronically to [place]. This allows for the quickest possible response. Request left on a nurse line can take longer to refill, since these are checked as time allows between office patients and other phone calls.  - refill request can take up to 3-5 working days to complete.  - If request is sent electronically and request is appropiate, it is usually completed in 1-2 business days.  - all patients will need to be seen routinely for all chronic medical conditions requiring prescription medications (see follow-up below). If you are overdue for follow up on your condition, you will be asked to make an appointment and we will call in enough medication to cover you until your appointment (up to 30 days).  - all controlled substances will require a face to face visit to request/refill.  - if you desire your prescriptions to go through a new pharmacy, and have an active script at original pharmacy, you will need to call your pharmacy and have scripts transferred to new pharmacy. This is completed between the pharmacy locations and not by your  provider.    Results: If any images or labs were ordered, it can take up to 1 week to get results depending on the test ordered and the lab/facility running and resulting the test. - Normal or stable results, which do not need further discussion, may be released to your mychart immediately with attached note to you. A call may not be generated for normal results. Please make certain to sign up for mychart. If you have questions on how to activate your mychart you can call the front office.  - If your results need further discussion, our office will attempt to contact you via phone, and if unable to reach you after 2 attempts, we will release your abnormal result to your mychart with instructions.  - All results will be automatically released in mychart after 1 week.  - Your provider will provide you with explanation and instruction on all relevant  material in your results. Please keep in mind, results and labs may appear confusing or abnormal to the untrained eye, but it does not mean they are actually abnormal for you personally. If you have any questions about your results that are not covered, or you desire more detailed explanation than what was provided, you should make an appointment with your provider to do so.   Our office handles many outgoing and incoming calls daily. If we have not contacted you within 1 week about your results, please check your mychart to see if there is a message first and if not, then contact our office.  In helping with this matter, you help decrease call volume, and therefore allow us to be able to respond to patients needs more efficiently.   Acute office visits (sick visit):  An acute visit is intended for a new problem and are scheduled in shorter time slots to allow schedule openings for patients with new problems. This is the appropriate visit to discuss a new problem. In order to provide you with excellent quality medical care with proper time for you to explain your problem, have an exam and receive treatment with instructions, these appointments should be limited to one new problem per visit. If you experience a new problem, in which you desire to be addressed, please make an acute office visit, we save openings on the schedule to accommodate you. Please do not save your new problem for any other type of visit, let us take care of it properly and quickly for you.   Follow up visits:  Depending on your condition(s) your provider will need to see you routinely in order to provide you with quality care and prescribe medication(s). Most chronic conditions (Example: hypertension, Diabetes, depression/anxiety... etc), require visits a couple times a year. Your provider will instruct you on proper follow up for your personal medical conditions and history. Please make certain to make follow up appointments for your  condition as instructed. Failing to do so could result in lapse in your medication treatment/refills. If you request a refill, and are overdue to be seen on a condition, we will always provide you with a 30 day script (once) to allow you time to schedule.    Medicare wellness (well visit): - we have a wonderful Nurse Selena Batten(Kim), that will meet with you and provide you will yearly medicare wellness visits. These visits should occur yearly (can not be scheduled less than 1 calendar year apart) and cover preventive health, immunizations, advance directives and screenings you are entitled to yearly through your medicare benefits. Do not  miss out on your entitled benefits, this is when medicare will pay for these benefits to be ordered for you.  These are strongly encouraged by your provider and is the appropriate type of visit to make certain you are up to date with all preventive health benefits. If you have not had your medicare wellness exam in the last 12 months, please make certain to schedule one by calling the office and schedule your medicare wellness with Selena Batten as soon as possible.   Yearly physical (well visit):  - Adults are recommended to be seen yearly for physicals. Check with your insurance and date of your last physical, most insurances require one calendar year between physicals. Physicals include all preventive health topics, screenings, medical exam and labs that are appropriate for gender/age and history. You may have fasting labs needed at this visit. This is a well visit (not a sick visit), new problems should not be covered during this visit (see acute visit).  - Pediatric patients are seen more frequently when they are younger. Your provider will advise you on well child visit timing that is appropriate for your their age. - This is not a medicare wellness visit. Medicare wellness exams do not have an exam portion to the visit. Some medicare companies allow for a physical, some do not allow a  yearly physical. If your medicare allows a yearly physical you can schedule the medicare wellness with our nurse Selena Batten and have your physical with your provider after, on the same day. Please check with insurance for your full benefits.   Late Policy/No Shows:  - all new patients should arrive 15-30 minutes earlier than appointment to allow [place] time  to  obtain all personal demographics,  insurance information and for you to complete office paperwork. - All established patients should arrive 10-15 minutes earlier than appointment time to update all information and be checked in .  - In our best efforts to run on time, if you are late for your appointment you will be asked to either reschedule or if able, we will work you back into the schedule. There will be a wait time to work you back in the schedule,  depending on availability.  - If you are unable to make it to your appointment as scheduled, please call 24 hours ahead of time to allow [place] to fill the time slot with someone else who needs to be seen. If you do not cancel your appointment ahead of time, you may be charged a no show fee.

## 2017-04-24 ENCOUNTER — Ambulatory Visit (HOSPITAL_COMMUNITY)
Admission: RE | Admit: 2017-04-24 | Discharge: 2017-04-24 | Disposition: A | Discharge status: Home / Self Care | Payer: 59 | Source: Ambulatory Visit | Attending: Internal Medicine | Admitting: Internal Medicine

## 2017-04-24 ENCOUNTER — Telehealth: Payer: Self-pay | Admitting: Family Medicine

## 2017-04-24 ENCOUNTER — Ambulatory Visit (HOSPITAL_COMMUNITY): Admission: RE | Admit: 2017-04-24 | Payer: 59 | Source: Ambulatory Visit

## 2017-04-24 DIAGNOSIS — I351 Nonrheumatic aortic (valve) insufficiency: Secondary | ICD-10-CM | POA: Diagnosis present

## 2017-04-24 DIAGNOSIS — D649 Anemia, unspecified: Secondary | ICD-10-CM

## 2017-04-24 DIAGNOSIS — R0789 Other chest pain: Secondary | ICD-10-CM | POA: Diagnosis present

## 2017-04-24 DIAGNOSIS — R0600 Dyspnea, unspecified: Secondary | ICD-10-CM

## 2017-04-24 DIAGNOSIS — J3489 Other specified disorders of nose and nasal sinuses: Secondary | ICD-10-CM

## 2017-04-24 LAB — CBC WITH DIFFERENTIAL/PLATELET
BASOS ABS: 22 {cells}/uL (ref 0–200)
Basophils Relative: 0.4 %
EOS ABS: 61 {cells}/uL (ref 15–500)
Eosinophils Relative: 1.1 %
HEMATOCRIT: 41.4 % (ref 38.5–50.0)
HEMOGLOBIN: 13.8 g/dL (ref 13.2–17.1)
LYMPHS ABS: 1843 {cells}/uL (ref 850–3900)
MCH: 27.9 pg (ref 27.0–33.0)
MCHC: 33.3 g/dL (ref 32.0–36.0)
MCV: 83.8 fL (ref 80.0–100.0)
MPV: 10.8 fL (ref 7.5–12.5)
Monocytes Relative: 12.9 %
NEUTROS ABS: 2866 {cells}/uL (ref 1500–7800)
NEUTROS PCT: 52.1 %
Platelets: 239 10*3/uL (ref 140–400)
RBC: 4.94 10*6/uL (ref 4.20–5.80)
RDW: 13.2 % (ref 11.0–15.0)
Total Lymphocyte: 33.5 %
WBC: 5.5 10*3/uL (ref 3.8–10.8)
WBCMIX: 710 {cells}/uL (ref 200–950)

## 2017-04-24 LAB — IRON,TIBC AND FERRITIN PANEL
%SAT: 28 % (calc) (ref 15–60)
Ferritin: 128 ng/mL (ref 20–345)
IRON: 83 ug/dL (ref 50–195)
TIBC: 299 mcg/dL (calc) (ref 250–425)

## 2017-04-24 MED ORDER — NITROGLYCERIN 0.4 MG SL SUBL
0.8000 mg | SUBLINGUAL_TABLET | Freq: Once | SUBLINGUAL | Status: AC
  Administered 2017-04-24: 0.8 mg via SUBLINGUAL
  Filled 2017-04-24: qty 25

## 2017-04-24 MED ORDER — NITROGLYCERIN 0.4 MG SL SUBL
SUBLINGUAL_TABLET | SUBLINGUAL | Status: AC
  Filled 2017-04-24: qty 2

## 2017-04-24 MED ORDER — IOPAMIDOL (ISOVUE-370) INJECTION 76%
INTRAVENOUS | Status: AC
  Administered 2017-04-24: 80 mL
  Filled 2017-04-24: qty 100

## 2017-04-24 NOTE — Telephone Encounter (Signed)
Please call pt: - his labs are normal. His iron is well supplemented and he no longer as anemia. I would encourage him to continue taking the iron supplement at the dose he is currently taking, it has had positive effects to his labs.  - his thyroid is functioning normal.  - Given these initial labs are normal, I would like to pursue further work up for autoimmune and inflammatory  causes for his symptoms: including lupus and sarcoid.  I have placed additional labs to be collected by lab appt, with a provider appt 2 days after collection of labs  to review all results and formulate further plan.

## 2017-04-24 NOTE — Telephone Encounter (Signed)
Spoke with patient reviewed lab results and instructions. Patient verbalized understanding. Scheduled patient lab and provider appts.

## 2017-04-25 ENCOUNTER — Telehealth: Payer: Self-pay

## 2017-04-25 DIAGNOSIS — R079 Chest pain, unspecified: Secondary | ICD-10-CM | POA: Diagnosis not present

## 2017-04-25 NOTE — Telephone Encounter (Signed)
Spoke with Vincent Ibarra and was giving his results when he said that he received them already.. Patient verbalized understanding.

## 2017-04-28 ENCOUNTER — Other Ambulatory Visit: Payer: Self-pay

## 2017-04-28 ENCOUNTER — Emergency Department (HOSPITAL_COMMUNITY)
Admission: EM | Admit: 2017-04-28 | Discharge: 2017-04-28 | Disposition: A | Discharge status: Home / Self Care | Payer: 59 | Attending: Emergency Medicine | Admitting: Emergency Medicine

## 2017-04-28 ENCOUNTER — Encounter (HOSPITAL_COMMUNITY): Payer: Self-pay | Admitting: *Deleted

## 2017-04-28 DIAGNOSIS — I10 Essential (primary) hypertension: Secondary | ICD-10-CM | POA: Insufficient documentation

## 2017-04-28 DIAGNOSIS — R200 Anesthesia of skin: Secondary | ICD-10-CM | POA: Diagnosis present

## 2017-04-28 LAB — BASIC METABOLIC PANEL
Anion gap: 11 (ref 5–15)
BUN: 9 mg/dL (ref 6–20)
CHLORIDE: 104 mmol/L (ref 101–111)
CO2: 23 mmol/L (ref 22–32)
Calcium: 9.6 mg/dL (ref 8.9–10.3)
Creatinine, Ser: 0.95 mg/dL (ref 0.61–1.24)
GFR calc Af Amer: >60 mL/min (ref >60)
GFR calc non Af Amer: >60 mL/min (ref >60)
GLUCOSE: 96 mg/dL (ref 65–99)
POTASSIUM: 3.7 mmol/L (ref 3.5–5.1)
SODIUM: 138 mmol/L (ref 135–145)

## 2017-04-28 LAB — CBC
HCT: 39.8 % (ref 39.0–52.0)
HEMOGLOBIN: 13.7 g/dL (ref 13.0–17.0)
MCH: 29 pg (ref 26.0–34.0)
MCHC: 34.4 g/dL (ref 30.0–36.0)
MCV: 84.3 fL (ref 78.0–100.0)
Platelets: 252 10*3/uL (ref 150–400)
RBC: 4.72 MIL/uL (ref 4.22–5.81)
RDW: 12.3 % (ref 11.5–15.5)
WBC: 6.9 10*3/uL (ref 4.0–10.5)

## 2017-04-28 NOTE — ED Triage Notes (Signed)
Pt reports having numbness and tingling that started in right foot 3 days ago and has progressed up bilateral legs. Is ambulatory at triage and no weakness noted.

## 2017-04-28 NOTE — Discharge Instructions (Signed)
See your Physician for recheck.  Your glucose is normal today.  No sign of diabetes,  You have a normal complete blood count and normal basic metabolic panel.  Discuss our concerns about fibromyalgia with your physician.

## 2017-04-28 NOTE — ED Provider Notes (Signed)
[facility] EMERGENCY DEPARTMENT Provider Note   CSN: [id number] Arrival date & time: 04/28/17  1141     History   Chief Complaint Chief Complaint  Patient presents with  . Numbness    HPI Dedric Ethington is a 27 y.o. male.  The history is provided by the patient. No language interpreter was used.  Foot Pain  This is a new problem. The current episode started more than 2 days ago. The problem occurs constantly. The problem has not changed since onset.Nothing aggravates the symptoms. Nothing relieves the symptoms. He has tried nothing for the symptoms. The treatment provided no relief.  Pt complains of tingling in his left foot.  Pt reports he is wanting to be tested for diabetes, fibromyalgia and PAD.    Past Medical History:  Diagnosis Date  . Anemia    "since childhood" denies Sickle cell history  . Blood in stool   . Hypertension   . Left ventricular hypertrophy   . Mononucleosis     Patient Active Problem List   Diagnosis Date Noted  . Witnessed apneic spells 04/23/2017  . Snoring 04/23/2017  . Aortic valve regurgitation 03/23/2017  . Palpitations 02/23/2017  . Hypertension 02/23/2017    Past Surgical History:  Procedure Laterality Date  . NO PAST SURGERIES         Home Medications    Prior to Admission medications   Medication Sig Start Date End Date Taking? Authorizing Provider  amLODipine (NORVASC) 10 MG tablet Take 1 tablet (10 mg total) by mouth daily. 02/22/17 03/24/17  End, Cristal Deer, MD  carvedilol (COREG) 3.125 MG tablet Take 1 tablet (3.125 mg total) by mouth 2 (two) times daily. 03/22/17 06/20/17  End, Cristal Deer, MD    Family History Family History  Problem Relation Age of Onset  . Hypertension Other   . Diabetes Other   . Hypertension Mother   . Lupus Mother   . Hypertension Father   . Diabetes Father   . Hypertension Sister   . Mental illness Brother     Social History Social History   Tobacco Use    . Smoking status: Never Smoker  . Smokeless tobacco: Never Used  Substance Use Topics  . Alcohol use: Yes    Comment: Rare  . Drug use: Yes    Types: Marijuana     Allergies   Patient has no known allergies.   Review of Systems Review of Systems  All other systems reviewed and are negative.    Physical Exam Updated Vital Signs BP (!) 143/70 (BP Location: Right Arm)   Pulse 60   Temp 98.4 F (36.9 C) (Oral)   Resp 16   Ht 6\' 1"  (1.854 m)   Wt 81.6 kg (180 lb)   SpO2 100%   BMI 23.75 kg/m   Physical Exam  Constitutional: He appears well-developed and well-nourished.  HENT:  Head: Normocephalic.  Right Ear: External ear normal.  Left Ear: External ear normal.  Nose: Nose normal.  Mouth/Throat: Oropharynx is clear and moist.  Eyes: EOM are normal. Pupils are equal, round, and reactive to light.  Neck: Normal range of motion.  Cardiovascular: Normal rate.  Pulmonary/Chest: Effort normal.  Abdominal: Soft.  Musculoskeletal: Normal range of motion.  Good pulse, normal cap refill foot, no swelling  Negative homan's   Neurological: He is alert.  Skin: Skin is warm.  Psychiatric: He has a normal mood and affect.  Nursing note and vitals reviewed.    ED  Treatments / Results  Labs (all labs ordered are listed, but only abnormal results are displayed) Labs Reviewed  CBC  BASIC METABOLIC PANEL    EKG  EKG Interpretation None      MDM  Pt has a normal BMet and normal CBC.   Pt advised to follow up with his primary care MD.  Radiology No results found.  Procedures Procedures (including critical care time)   Medications Ordered in ED Medications - No data to display   Initial Impression / Assessment and Plan / ED Course  I have reviewed the triage vital signs and the nursing notes.  Pertinent labs & imaging results that were available during my care of the patient were reviewed by me and considered in my medical decision making (see chart for  details).    An After Visit Summary was printed and given to the patient.    Final Clinical Impressions(s) / ED Diagnoses   Final diagnoses:  Numbness    ED Discharge Orders    None    An After Visit Summary was printed and given to the patient.    Elson AreasSofia, Leslie K, PA-C 04/28/17 1522    Elson AreasSofia, Leslie K, PA-C 04/28/17 1533    Loren RacerYelverton, David, MD 04/28/17 2150

## 2017-04-28 NOTE — ED Notes (Signed)
Pt verbalized understanding discharge instructions and denies any further needs or questions at this time. VS stable, ambulatory and steady gait.   Pt upset that we did not do anything for him.  Pt seen by PA but was in a hallway bed.  Pt asked for multiple labs to be drawn and test to be done.  Pt has been looking on the Internet and thinks he has fibromyalgia, PAD, Diabetes and or DVT. PT upset and refuses to listen to instructions for follow up to PCP. Pt stormed out of ED.

## 2017-04-30 ENCOUNTER — Other Ambulatory Visit (INDEPENDENT_AMBULATORY_CARE_PROVIDER_SITE_OTHER): Payer: 59

## 2017-04-30 ENCOUNTER — Telehealth: Payer: Self-pay | Admitting: Family Medicine

## 2017-04-30 DIAGNOSIS — D649 Anemia, unspecified: Secondary | ICD-10-CM

## 2017-04-30 DIAGNOSIS — R0600 Dyspnea, unspecified: Secondary | ICD-10-CM | POA: Diagnosis not present

## 2017-04-30 DIAGNOSIS — J3489 Other specified disorders of nose and nasal sinuses: Secondary | ICD-10-CM

## 2017-04-30 LAB — URINALYSIS, ROUTINE W REFLEX MICROSCOPIC
Bilirubin Urine: NEGATIVE
Hgb urine dipstick: NEGATIVE
KETONES UR: NEGATIVE
Leukocytes, UA: NEGATIVE
Nitrite: NEGATIVE
RBC / HPF: NONE SEEN (ref 0–?)
SPECIFIC GRAVITY, URINE: 1.025 (ref 1.000–1.030)
Total Protein, Urine: NEGATIVE
Urine Glucose: NEGATIVE
Urobilinogen, UA: 0.2 (ref 0.0–1.0)
pH: 6 (ref 5.0–8.0)

## 2017-04-30 LAB — SEDIMENTATION RATE: SED RATE: 13 mm/h (ref 0–15)

## 2017-04-30 LAB — C-REACTIVE PROTEIN

## 2017-04-30 NOTE — Telephone Encounter (Signed)
Patient arrived to office today for scheduled lab appointment.  While checking in he stated he needed to know what labs were being drawn during the visit today because he felt additional labs needed to be drawn.  I advised patient I was not aware of which labs were being drawn and that he should check with Laura,CMA as she would be the one collecting his blood and she could check with his PCP if any additional labs needed to be added.  After having his blood collected patient returned to front desk and informed me that Vernona RiegerLaura instructed him to speak with me to have message sent to [place] regarding having additional tests ordered due to the symptoms patient reports.  I went and spoke with Rosalita ChessmanSuzanne and Dr. Claiborne BillingsKuneff about patient's request for additional lab orders and was advised to notify patient that Dr. Claiborne BillingsKuneff will review test results with him on 05/03/17, this is when he is scheduled to return for his follow-up appt.  If additional labs are warranted, they would be ordered at that time.  I relayed this information to the patient who became quite agitated and upset with my response.  He states he doesn't understand why the labs he wants ordered can't be ordered today and that Vernona RiegerLaura had drawn an extra tube of blood so these test could be ordered.    Patient states he would like the following tests ordered "PAD, diabetes & fibromyalgia".  I advised patient I would relay this information back to pcp and have someone follow-up with him.

## 2017-04-30 NOTE — Telephone Encounter (Signed)
Called patient tried to review information he stated I did not need to explain this to him he will address his concerns with the Dr at his appointment this week.

## 2017-04-30 NOTE — Telephone Encounter (Signed)
Please inform pt: - The labs collected today are testing for the concerns and symptoms he presented with last week.  Lab orders are placed only after evaluation for a condition/symptoms with a provider; the provider then orders the medically necessary test.  The labs he has requested to add on are not appropriate. PAD is not a lab. Fibromyalgia is a clinical diagnosis and there is no lab for it. We rule out other conditions, which is the blood test he had collected today.  Lastly he does not have diabetes. His glucose/sugar has been normal on all collections.  I hope this helps reassure him and clarifies the situation. More will be explained in detail to him at his appt when we discuss all lab work.     Documentation only:  Pt is a new pt to this office, with 1 visit. He has documentation of agitation with Cardiology staff  and today became agitated with staff for not ordering the labs he wanted. Please send to office director, pt may need counseled for inappropriate behavior.

## 2017-05-02 ENCOUNTER — Telehealth: Payer: Self-pay | Admitting: Family Medicine

## 2017-05-02 NOTE — Telephone Encounter (Signed)
°  I spoke with Dr. Claiborne BillingsKuneff, who after reviewing patients labs, agreed to work pt in today to be seen to go over his lab results. He does have one outstanding lab that she said she would discuss with him today and called with results when it was available. I called [name] to advise him of this and schedule him an appointment. He declined an appointment citing that he had to work and that the doctor could just email him or call him with results. I explained that that was not a possibility. Dr. Claiborne BillingsKuneff would give him his lab results at his follow up visit. I offered to schedule an  appointment with him again. He became upset and said that the person at the front desk today assured him that the provider would give him his results in an email or over the phone. I explained to him that I was here and I did overhear the conversation and that is not what took place. Vincent Ibarra agreed to send a staff message to the provider citing that that was the patients request, that does not mean that the provider agrees. Patient said that he wanted me and my boss to be here when he came for his next visit  because he demanded  an explanation. Said he would go ahead and schedule an appointment but this was bull%%%%. He said go ahead and schedule me an appointment you dummy. I further explained to him that profanity and abusive behavior towards staff would not be tolerated and would warrant a dismissal. Patient said whatever, after I get my lab results Im never coming back to your office anyway. I offered the patient an appointment again and asked him what time he would like to come in. He said in the morning. I offered him 8:30am appointment for tomorrow morning.  He called me a dummy again and said I told you I cant  come tomorrow morning. I apologized and said her next available appointment is Tuesday, February 26 at 9:15 AM. At that point I realized the patient had either disconnected the call or the call had been  dropped. I attempted to call the patient back and reached his voicemail. I left him a message explaining that her next available morning appointment is February 26 at 9:15 AM. Advised him that if this did not work for him he could give us a call back to reschedule.

## 2017-05-02 NOTE — Telephone Encounter (Signed)
Dr Claiborne BillingsKuneff given information.

## 2017-05-02 NOTE — Telephone Encounter (Signed)
Patients labs were pre-collected to his follow up appt on his initial complaints with this provider.This was completed prior to his appt, instead of at his follow appt with provider, in order for detailed discussion and medical recommendations could be provided to him in more efficient fashion. He agreed to that plan prior to collection of labs and has since gone against his word to follow up.   He will have the results by mychart regardless, as they automatically release in a certain time frame. Further discussion surrounding his symptoms, medical evaluation and professional recommendations will only be provided in a face to face encounter as initially agreed upon, as pre-labs were completed as a courtesy.    This is his 3rd episode, at this office alone, he has become inappropriately  agitated and/or verbally abusive to staff when we have gone out of our way to be more than accommodating to him.  He has had a verbal warning that abusive behavior will not be tolerated and he again called staff inappropriate names. Any further inappropriate behavior will be grounds for a dismissal.   If he no shows on his scheduled appt time Feb.26 at 9:15, I will take that  as a confirmation of his "desire to not return to our Office."   A letter of termination of dr/pt relationship would then be generated and this provider will be removed as his PCP.  Until that time, every appropriate opportunity will provided to him to reschedule follow up and confirm appt with him.    Electronically Signed by: Felix Pacinienee Ronette Hank, DO [facility] [place]

## 2017-05-02 NOTE — Telephone Encounter (Signed)
Patient arrived to office for visit he thought he had scheduled for today.  I informed patient his appointment is actually scheduled for tomorrow, 05/03/17 at 8:30am.  Patient asked if he could be seen today since he was here, I spoke with Rosalita ChessmanSuzanne, LPN who advised all of the patient's lab results were not back so pcp would not be able to go over test results with him today.  I advised patient of this and he states he is not able to come back to office tomorrow due to his schedule.  He requested to cancel appt scheduled for tomorrow and asked that he be called with test results once they are back, tel # [phone].

## 2017-05-03 ENCOUNTER — Ambulatory Visit: Payer: 59 | Admitting: Family Medicine

## 2017-05-03 LAB — EXTRA LAV TOP TUBE

## 2017-05-03 LAB — ANA: ANA: NEGATIVE

## 2017-05-03 LAB — ANGIOTENSIN CONVERTING ENZYME: ANGIOTENSIN-CONVERTING ENZYME: 20 U/L (ref 9–67)

## 2017-05-08 ENCOUNTER — Ambulatory Visit: Payer: 59 | Admitting: Family Medicine

## 2017-05-10 ENCOUNTER — Encounter: Payer: Self-pay | Admitting: Family Medicine

## 2017-05-10 ENCOUNTER — Ambulatory Visit (INDEPENDENT_AMBULATORY_CARE_PROVIDER_SITE_OTHER): Payer: 59 | Admitting: Family Medicine

## 2017-05-10 VITALS — BP 126/75 | HR 55 | Temp 97.5°F | Resp 20 | Ht 73.0 in | Wt 176.0 lb

## 2017-05-10 DIAGNOSIS — M797 Fibromyalgia: Secondary | ICD-10-CM

## 2017-05-10 MED ORDER — DULOXETINE HCL 30 MG PO CPEP: ORAL_CAPSULE | ORAL | 0 refills | Status: DC

## 2017-05-10 NOTE — Progress Notes (Signed)
Vincent Ibarra , 03/03/1991, 27 y.o., male MRN: [id number] Patient Care Team    Relationship Specialty Notifications Start End  Natalia LeatherwoodKuneff, Renee A, DO PCP - General Family Medicine  04/23/17   Yvonne KendallEnd, Christopher, MD PCP - Cardiology Cardiology Admissions 02/22/17   Flo ShanksWolicki, Karol, MD Consulting Physician Otolaryngology  04/23/17     Chief Complaint  Patient presents with  . Results    recent labs     Subjective: Pt presents for an OV to review workup surrounding multiple complaints voiced on establishment visit. Today patient complains of mild right foot numbness along his medial arch, 4 foot large toe. He is concerned about fibromyalgia, peripheral artery disease and diabetes. He also endorses continued leg cramps in his bilateral thighs. Patient was seen in the emergency room on 04/28/2017 for this condition with a normal CBC and BMP. His original complaint on presentation on establishment was dyspnea which has been worked up through cardiology. He does have hypertension which is being treated by cardiology. He feels his dyspnea improved after starting iron. He was encouraged to have a sleep study and hasn't scheduled a sleep study for the end of February.  Depression screen PHQ 2/9 04/23/2017  Decreased Interest 0  Down, Depressed, Hopeless 0  PHQ - 2 Score 0    No Known Allergies Social History   Tobacco Use  . Smoking status: Never Smoker  . Smokeless tobacco: Never Used  Substance Use Topics  . Alcohol use: Yes    Comment: Rare   Past Medical History:  Diagnosis Date  . Anemia    "since childhood" denies Sickle cell history  . Blood in stool   . Hypertension   . Left ventricular hypertrophy   . Mononucleosis    Past Surgical History:  Procedure Laterality Date  . NO PAST SURGERIES     Family History  Problem Relation Age of Onset  . Hypertension Other   . Diabetes Other   . Hypertension Mother   . Lupus Mother   . Hypertension Father   . Diabetes Father     . Hypertension Sister   . Mental illness Brother    Allergies as of 05/10/2017   No Known Allergies     Medication List        Accurate as of 05/10/17  8:26 AM. Always use your most recent med list.          amLODipine 10 MG tablet Commonly known as:  NORVASC Take 1 tablet (10 mg total) by mouth daily.   carvedilol 3.125 MG tablet Commonly known as:  COREG Take 1 tablet (3.125 mg total) by mouth 2 (two) times daily.       All past medical history, surgical history, allergies, family history, immunizations andmedications were updated in the EMR today and reviewed under the history and medication portions of their EMR.     ROS: Negative, with the exception of above mentioned in HPI   Objective:  BP 126/75 (BP Location: Left Arm, Patient Position: Sitting, Cuff Size: Normal)   Pulse (!) 55   Temp (!) 97.5 F (36.4 C)   Resp 20   Ht 6\' 1"  (1.854 m)   Wt 176 lb (79.8 kg)   SpO2 99%   BMI 23.22 kg/m  Body mass index is 23.22 kg/m. Gen: Afebrile. No acute distress. Nontoxic in appearance, well developed, well nourished.  HENT: AT. [place].  MMM, no oral lesions. Eyes:Pupils Equal Round Reactive to light, Extraocular movements intact,  Conjunctiva  without redness, discharge or icterus. Neck/lymp/endocrine: Supple, no lymphadenopathy CV: RRR no murmur, no edema Chest: CTAB, no wheeze or crackles. Good air movement, normal resp effort.  Neuro/MSK:  Normal gait. PERLA. EOMi. Alert. Oriented x3. No tenderness to palpation right foot. Good cap refill. Sensation intact. Psych: Normal affect, dress and demeanor. Normal speech. Normal thought content and judgment.  No exam data present No results found. No results found for this or any previous visit (from the past 24 hour(s)).  Assessment/Plan: Vincent Ibarra is a 27 y.o. male present for OV for  Fibromyalgia Patient's multiple complaints could be secondary to fibromyalgia and/or anxiety. Discussed options with him  today which included massage, heat therapy, exercise (aquatics helpful) and medications. He is open to start Cymbalta to see if this helpful. Reviewed all labs with him in person today: Negative workup for autoimmune disorders, sarcoid or inflammatory disorders. Patient's glucose is normal. He has follow-up with cardiology scheduled this week. He has a sleep study scheduled. Follow-up 4 weeks    Reviewed expectations re: course of current medical issues.  Discussed self-management of symptoms.  Outlined signs and symptoms indicating need for more acute intervention.  Patient verbalized understanding and all questions were answered.  Patient received an After-Visit Summary.    No orders of the defined types were placed in this encounter.    Note is dictated utilizing voice recognition software. Although note has been proof read prior to signing, occasional typographical errors still can be missed. If any questions arise, please do not hesitate to call for verification.   electronically signed by:  Felix Pacini, DO  [facility] [place]

## 2017-05-10 NOTE — Patient Instructions (Signed)
Start Cymbalta as instructed on taper.  Follow up in 1 month (before you run out of medicine.   I do not believe you need vascular studies, but the test you may be interested is called ABI.     Tarsal Tunnel Syndrome Tarsal tunnel syndrome is a condition that happens when a nerve (tibial nerve) is irritated or squeezed (compressed) as it passes through an area on the inside of your ankle (tarsal tunnel). The tarsal tunnel is a narrow passage through the connective tissue and bones in your feet (tarsals). The tibial nerve passes behind the large bony bump at your inner ankle (medial malleolus) and sends branches to your foot and toes. This nerve helps supply feeling to your heel, to the bottom of your foot, and to some of your toe muscles. Tarsal tunnel syndrome usually causes ankle and foot pain that gets worse with activity. What are the causes? Tarsal tunnel syndrome can be caused by any condition that narrows the space in the tarsal tunnel. Athletes may get tarsal tunnel syndrome from a fractured ankle or from an outward (eversion) ankle sprain that results in scarring or swelling. Other common causes include:  Over-pronation. This is when your feet roll in and flatten too much when you stand, walk, or run.  Extra pressure on the tarsal tunnel area from tight or stiff shoes or boots.  Decreased room in the tarsal tunnel due to small, fluid-filled sacs (cysts) or growths on the bones near the tunnel (exostosis).  What increases the risk? This condition is more likely to develop in people who:  Play sports where they wear high, stiff boots, such as downhill skiing.  Play sports with repetitive motion, such as running.  Play sports on uneven surfaces that can lead to a sprained ankle, such as soccer or football.  Have had an inner ankle injury.  Have flat feet.  Have a condition such as diabetes, hypothyroidism, or rheumatoid arthritis.  What are the signs or symptoms? Symptoms of  this condition include:  Burning pain behind the ankle, in the heel, or in the foot that gets worse if you are standing, walking, or running.  Numbness or a tingling sensation (pins and needles) in your heel, foot, or toes.  At first, your symptoms may get worse with activity and be relieved with rest. Over time, your symptoms may become constant or come on sooner with less activity. How is this diagnosed? This condition is diagnosed based on your symptoms, medical history, and physical exam. During the exam, your health care provider may tap on the area below your ankle to check for tingling in your foot or toes. Your health care provider may also inject numbing medicine into the tarsal tunnel to see if it relieves your pain. You may also have other tests, including:  X-rays to check bone structure.  MRI or ultrasound to examine nerve and tendon structures and find where your nerve is getting compressed.  An electrical study of nerve function (electromyography, or EMG).  How is this treated? Treatment may include:  Wearing a removable splint or boot for ankle support.  Using a shoe insert (orthotic) to help support your arch.  Using ice to reduce swelling.  Taking pain medicine.  Having medicine injected into your ankle joint to reduce pain and swelling.  Starting range-of-motion exercises and strengthening exercises.  Gradually returning to full activity. The timing will depend on the severity of your condition and your response to treatment.  You may need surgery  if there is a soft tissue growth or a bone growth, or if other treatments have not helped. After surgery, you may need to wear a removable splint or boot for support. You also may need physical therapy. Follow these instructions at home: If you have a splint or boot:  Wear the splint or boot as told by your health care provider. Remove it only as told by your health care provider.  Loosen the splint or boot if your  toes tingle, become numb, or turn cold and blue.  If your splint or boot is not waterproof: ? Do not let it get wet. ? Cover it with a watertight covering when you take a bath or shower.  Keep the splint or boot clean. Managing pain, stiffness, and swelling  If directed, put ice on the injured area. ? Put ice in a plastic bag. ? Place a towel between your skin and the bag. ? Leave the ice on for 20 minutes, 2-3 times a day.  Move your toes often to avoid stiffness and to lessen swelling.  Raise (elevate) your foot above the level of your heart while you are sitting or lying down. Driving  Ask your health care provider when it is safe to drive if you have a splint or boot on a foot that you use for driving.  Do not drive or operate heavy machinery while taking prescription pain medicine. Activity  Return to your normal activities as told by your health care provider. Ask your health care provider what activities are safe for you.  Do exercises as told by your health care provider. General instructions  Do not use any tobacco products, such as cigarettes, chewing tobacco, and e-cigarettes. Tobacco can delay healing. If you need help quitting, ask your health care provider.  Take over-the-counter and prescription medicines only as told by your health care provider.  Keep all follow-up visits as told by your health care provider. This is important. How is this prevented?  Give your body time to rest between periods of activity.  Make sure to wear supportive and comfortable shoes during athletic activity.  Do not over-tighten ski boots or the laces on high-top shoes.  Be safe and responsible while being active to avoid falls. Contact a health care provider if:  Your ankle pain is not getting better.  You are unable to support (bear) body weight on your foot without pain. This information is not intended to replace advice given to you by your health care provider. Make sure  you discuss any questions you have with your health care provider. Document Released: 02/27/2005 Document Revised: 11/03/2015 Document Reviewed: 02/09/2015 Elsevier Interactive Patient Education  2018 [employer]   Myofascial Pain Syndrome and Fibromyalgia Myofascial pain syndrome and fibromyalgia are both pain disorders. This pain may be felt mainly in your muscles.  Myofascial pain syndrome: ? Always has trigger points or tender points in the muscle that will cause pain when pressed. The pain may come and go. ? Usually affects your neck, upper back, and shoulder areas. The pain often radiates into your arms and hands.  Fibromyalgia: ? Has muscle pains and tenderness that come and go. ? Is often associated with fatigue and sleep disturbances. ? Has trigger points. ? Tends to be long-lasting (chronic), but is not life-threatening.  Fibromyalgia and myofascial pain are not the same. However, they often occur together. If you have both conditions, each can make the other worse. Both are common and can cause enough pain  and fatigue to make day-to-day activities difficult. What are the causes? The exact causes of fibromyalgia and myofascial pain are not known. People with certain gene types may be more likely to develop fibromyalgia. Some factors can be triggers for both conditions, such as:  Spine disorders.  Arthritis.  Severe injury (trauma) and other physical stressors.  Being under a lot of stress.  A medical illness.  What are the signs or symptoms? Fibromyalgia The main symptom of fibromyalgia is widespread pain and tenderness in your muscles. This can vary over time. Pain is sometimes described as stabbing, shooting, or burning. You may have tingling or numbness, too. You may also have sleep problems and fatigue. You may wake up feeling tired and groggy (fibro fog). Other symptoms may include:  Bowel and bladder problems.  Headaches.  Visual problems.  Problems with  odors and noises.  Depression or mood changes.  Painful menstrual periods (dysmenorrhea).  Dry skin or eyes.  Myofascial pain syndrome Symptoms of myofascial pain syndrome include:  Tight, ropy bands of muscle.  Uncomfortable sensations in muscular areas, such as: ? Aching. ? Cramping. ? Burning. ? Numbness. ? Tingling. ? Muscle weakness.  Trouble moving certain muscles freely (range of motion).  How is this diagnosed? There are no specific tests to diagnose fibromyalgia or myofascial pain syndrome. Both can be hard to diagnose because their symptoms are common in many other conditions. Your health care provider may suspect one or both of these conditions based on your symptoms and medical history. Your health care provider will also do a physical exam. The key to diagnosing fibromyalgia is having pain, fatigue, and other symptoms for more than three months that cannot be explained by another condition. The key to diagnosing myofascial pain syndrome is finding trigger points in muscles that are tender and cause pain elsewhere in your body (referred pain). How is this treated? Treating fibromyalgia and myofascial pain often requires a team of health care providers. This usually starts with your primary provider and a physical therapist. You may also find it helpful to work with alternative health care providers, such as massage therapists or acupuncturists. Treatment for fibromyalgia may include medicines. This may include nonsteroidal anti-inflammatory drugs (NSAIDs), along with other medicines. Treatment for myofascial pain may also include:  NSAIDs.  Cooling and stretching of muscles.  Trigger point injections.  Sound wave (ultrasound) treatments to stimulate muscles.  Follow these instructions at home:  Take medicines only as directed by your health care provider.  Exercise as directed by your health care provider or physical therapist.  Try to avoid stressful  situations.  Practice relaxation techniques to control your stress. You may want to try: ? Biofeedback. ? Visual imagery. ? Hypnosis. ? Muscle relaxation. ? Yoga. ? Meditation.  Talk to your health care provider about alternative treatments, such as acupuncture or massage treatment.  Maintain a healthy lifestyle. This includes eating a healthy diet and getting enough sleep.  Consider joining a support group.  Do not do activities that stress or strain your muscles. That includes repetitive motions and heavy lifting. Where to find more information:  National Fibromyalgia Association: www.fmaware.org  Arthritis Foundation: www.arthritis.org  American Chronic Pain Association: Gum[employer].nl Contact a health care provider if:  You have new symptoms.  Your symptoms get worse.  You have side effects from your medicines.  You have trouble sleeping.  Your condition is causing depression or anxiety. This information is not intended to replace advice given to you by your health  care provider. Make sure you discuss any questions you have with your health care provider. Document Released: 02/27/2005 Document Revised: 08/05/2015 Document Reviewed: 12/03/2013 Elsevier Interactive Patient Education  [employer].

## 2017-05-11 ENCOUNTER — Encounter: Payer: Self-pay | Admitting: Internal Medicine

## 2017-05-11 ENCOUNTER — Ambulatory Visit (INDEPENDENT_AMBULATORY_CARE_PROVIDER_SITE_OTHER): Payer: 59 | Admitting: Internal Medicine

## 2017-05-11 VITALS — BP 118/78 | HR 70 | Ht 73.0 in | Wt 174.0 lb

## 2017-05-11 DIAGNOSIS — I351 Nonrheumatic aortic (valve) insufficiency: Secondary | ICD-10-CM | POA: Diagnosis not present

## 2017-05-11 DIAGNOSIS — I10 Essential (primary) hypertension: Secondary | ICD-10-CM | POA: Diagnosis not present

## 2017-05-11 DIAGNOSIS — R079 Chest pain, unspecified: Secondary | ICD-10-CM | POA: Diagnosis not present

## 2017-05-11 MED ORDER — FAMOTIDINE 20 MG PO TABS: 20.0000 mg | ORAL_TABLET | Freq: Two times a day (BID) | ORAL | 3 refills | Status: DC

## 2017-05-11 NOTE — Patient Instructions (Addendum)
Medication Instructions:   Start Famotidine (pepcid) 20 mg twice per day   -- If you need a refill on your cardiac medications before your next appointment, please call your pharmacy. --  Labwork: Serum Aldosterone/Plasma Renin Activity  Testing/Procedures: Please schedule, thank you..  Your physician has requested that you have a renal artery duplex. During this test, an ultrasound is used to evaluate blood flow to the kidneys. Allow one hour for this exam. Do not eat after midnight the day before and avoid carbonated beverages. Take your medications as you usually do.    Follow-Up: Your physician wants you to follow-up in: 3 MONTHS with Dr. Okey DupreEnd.    Thank you for choosing [facility]!!    Any Other Special Instructions Will Be Listed Below (If Applicable).

## 2017-05-11 NOTE — Progress Notes (Signed)
Follow-up Outpatient Visit Date: 05/11/2017  Primary Care Provider: Natalia Leatherwood, DO [address] [place] [address]  Chief Complaint: Chest pain, leg pain, and shortness of breath  HPI:  Vincent Ibarra is a 27 y.o. year-old male with history of hypertension and trivial aortic regurgitation, who presents for follow-up of chest pain and palpitations. I last saw him on 03/22/17, at which time he continued to complain of intermittent chest pain. Interestingly, the pain improved after beginning ferrous sulfate. Subsequent coronary CTA was normal without coronary anomalies, coronary artery calcification, or stenosis.  Today, Vincent Ibarra has multiple complaints, including continued chest pain now most pronounced with deep inspiration and after eating. He has been losing weight because he is afraid of eating most foods. He notes that his chest pain did improve somewhat with addition of carvedilol. He continues to have significant exertional dyspnea as well as thigh cramps with activity. He is no longer able to exercise or play basketball on account of this. He has also developed numbness in the right foot over the last 2 weeks.  Vincent Ibarra was recently seen by his PCP and underwent autoimmune testing that was negative. He was started on duloxetine for possible fibromyalgia. He has not noticed any improvement in his symptoms.  --------------------------------------------------------------------------------------------------  Cardiovascular History & Procedures: Cardiovascular Problems:  Chest pain  Palpitations  Trivial aortic regurgitation  Risk Factors:  Hypertension and male gender  Cath/PCI:  None  CV Surgery:  None  EP Procedures and Devices:  None  Non-Invasive Evaluation(s):  Coronary CTA (04/24/17): Normal right dominant coronary arteries without stenosis. Coronary calcium score 0. Normal aortic root, measuring 3.3 cm. No significant incidental noncardiac findings  in the thorax.  Transthoracic echocardiogram (02/28/17): Normal LV size and wall thickness. LVEF 60-65% with normal wall motion. Normal diastolic function. Trivial aortic regurgitation. Normal RV size and function. Normal CVP.  Recent CV Pertinent Labs: Lab Results  Component Value Date   K 3.7 04/28/2017   BUN 9 04/28/2017   CREATININE 0.95 04/28/2017    Past medical and surgical history were reviewed and updated in EPIC.  Current Meds  Medication Sig  . amLODipine (NORVASC) 10 MG tablet Take 1 tablet (10 mg total) by mouth daily.  . carvedilol (COREG) 3.125 MG tablet Take 1 tablet (3.125 mg total) by mouth 2 (two) times daily.  . DULoxetine (CYMBALTA) 30 MG capsule Take 1 capsule (30 mg total) by mouth daily for 7 days, THEN 2 capsules (60 mg total) daily for 23 days.    Allergies: Patient has no known allergies.  Social History   Socioeconomic History  . Marital status: Single    Spouse name: Not on file  . Number of children: Not on file  . Years of education: Not on file  . Highest education level: Not on file  Social Needs  . Financial resource strain: Not on file  . Food insecurity - worry: Not on file  . Food insecurity - inability: Not on file  . Transportation needs - medical: Not on file  . Transportation needs - non-medical: Not on file  Occupational History  . Occupation: Art gallery manager  Tobacco Use  . Smoking status: Never Smoker  . Smokeless tobacco: Never Used  Substance and Sexual Activity  . Alcohol use: Yes    Comment: Rare  . Drug use: Yes    Types: Marijuana  . Sexual activity: Yes    Partners: Female  Other Topics Concern  . 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    College educated .   Routine exercise.   Takes a daily vitamin.   Uses herbal remedies.   Smoke alarm in the home. Her seatbelt.   Feels safe in his relationships.    Family History  Problem Relation Age of Onset  . Hypertension Other   .  Diabetes Other   . Hypertension Mother   . Lupus Mother   . Hypertension Father   . Diabetes Father   . Hypertension Sister   . Mental illness Brother     Review of Systems: A 12-system review of systems was performed and was negative except as noted in the HPI.  --------------------------------------------------------------------------------------------------  Physical Exam: BP (!) 120/94 (BP Location: Left Arm, Patient Position: Sitting, Cuff Size: Normal)   Pulse 70   Ht 6\' 1"  (1.854 m)   Wt 174 lb (78.9 kg)   SpO2 98%   BMI 22.96 kg/m  Repeat BP: 118/78  General:  NAD HEENT: No conjunctival pallor or scleral icterus. Moist mucous membranes.  OP clear. Neck: Supple without lymphadenopathy, thyromegaly, JVD, or HJR. Lungs: Normal work of breathing. Clear to auscultation bilaterally without wheezes or crackles. Heart: Regular rate and rhythm without murmurs, rubs, or gallops. Non-displaced PMI. Abd: Bowel sounds present. Soft, NT/ND without hepatosplenomegaly Ext: No lower extremity edema. Radial, PT, and DP pulses are 2+ bilaterally. Skin: Warm and dry without rash.   Lab Results  Component Value Date   WBC 6.9 04/28/2017   HGB 13.7 04/28/2017   HCT 39.8 04/28/2017   MCV 84.3 04/28/2017   PLT 252 04/28/2017    Lab Results  Component Value Date   NA 138 04/28/2017   K 3.7 04/28/2017   CL 104 04/28/2017   CO2 23 04/28/2017   BUN 9 04/28/2017   CREATININE 0.95 04/28/2017   GLUCOSE 96 04/28/2017   ALT 20 01/20/2017    No results found for: CHOL, HDL, LDLCALC, LDLDIRECT, TRIG, CHOLHDL  --------------------------------------------------------------------------------------------------  ASSESSMENT AND PLAN: Chest pain This is non-cardiac. Echo and coronary CTA did not show any cause for Vincent Ibarra's atypical chest pain. No further cardiac workup is recommended. His symptoms could certainly be due to fibromyalgia and/or anxiety. I have also asked him to take  famotidine 20 mg BID until our follow-up visit, given post-prandial pain that may reflect GERD. I had previously recommended this at our initial consultation, though Vincent Ibarra now states that he never took it.  Aortic regurgitation Trivial AI noted on echo. Vincent Ibarra appears euvolemic. I do not believe that his numerous symptoms represent heart failure due to valvular heart disease. We will continue with blood pressure control.  Hypertension Initial diastolic pressure mildly elevated but normal on recheck. Hypertension in a 27 year old remains concerning, though secondary hypertension workup thus far has been unremarkable. We will complete the workup with renal artery Doppler and serum aldosterone/plasma renin activity assays. If these are normal, Vincent Ibarra should continue treatment for essential hypertension.  Follow-up: Return to clinic in 3 months.  Yvonne Kendallhristopher Tynetta Bachmann, MD 05/11/2017 10:03 AM

## 2017-05-14 ENCOUNTER — Encounter: Payer: Self-pay | Admitting: Family Medicine

## 2017-05-16 ENCOUNTER — Encounter (HOSPITAL_COMMUNITY): Payer: 59

## 2017-05-22 ENCOUNTER — Ambulatory Visit (HOSPITAL_COMMUNITY)
Admission: RE | Admit: 2017-05-22 | Payer: 59 | Source: Ambulatory Visit | Attending: Internal Medicine | Admitting: Internal Medicine

## 2017-05-25 ENCOUNTER — Other Ambulatory Visit: Payer: Self-pay | Admitting: Internal Medicine

## 2017-05-25 MED ORDER — AMLODIPINE BESYLATE 10 MG PO TABS: 10.0000 mg | ORAL_TABLET | Freq: Every day | ORAL | 11 refills | Status: DC

## 2017-05-29 ENCOUNTER — Ambulatory Visit (INDEPENDENT_AMBULATORY_CARE_PROVIDER_SITE_OTHER): Payer: 59 | Admitting: Physician Assistant

## 2017-05-29 ENCOUNTER — Encounter: Payer: Self-pay | Admitting: Physician Assistant

## 2017-05-29 VITALS — BP 116/64 | HR 56 | Ht 72.0 in | Wt 180.0 lb

## 2017-05-29 DIAGNOSIS — K644 Residual hemorrhoidal skin tags: Secondary | ICD-10-CM | POA: Diagnosis not present

## 2017-05-29 DIAGNOSIS — R0789 Other chest pain: Secondary | ICD-10-CM | POA: Diagnosis not present

## 2017-05-29 DIAGNOSIS — K625 Hemorrhage of anus and rectum: Secondary | ICD-10-CM

## 2017-05-29 DIAGNOSIS — K648 Other hemorrhoids: Secondary | ICD-10-CM | POA: Diagnosis not present

## 2017-05-29 MED ORDER — HYDROCORTISONE 2.5 % RE CREA: 1.0000 "application " | TOPICAL_CREAM | Freq: Two times a day (BID) | RECTAL | 1 refills | Status: DC

## 2017-05-29 MED ORDER — OMEPRAZOLE 40 MG PO CPDR: 40.0000 mg | DELAYED_RELEASE_CAPSULE | Freq: Every day | ORAL | 0 refills | Status: DC

## 2017-05-29 NOTE — Progress Notes (Signed)
Chief Complaint: Blood in stool and rectal pain  HPI:    Vincent Ibarra is a 27 year old AA male with a past medical history as listed below, who was referred to me by Felix Pacini A, DO for a complaint of blood in stool and rectal pain.      Per chart review patient recently saw cardiology 05/11/17 for noncardiac chest pain and was started on famotidine 20 mg twice daily.    Today, explains for past 2 weeks has been experiencing some bright red blood dripping from his rectum after bowel movements daily as well as itching and rectal fullness.  Describes soft solid stools 1-2 times per day which has not changed recently, though "maybe some straining" when patient takes iron as directed by his PCP.    Also describes a chest pain which occurs when his blood pressure increases or after exercise.  Apparently he is following with cardiology as above who has discussed that they think this may be related to reflux.  Patient describes being on over-the-counter Omeprazole for 2 weeks with no change in his symptoms.  He is requesting a new cardiologist who will possibly do a stress test which he thinks he needs.    Denies fever, chills, weight loss, anorexia, nausea, vomiting, heartburn, reflux, family history of IBD or colon cancer.  Past Medical History:  Diagnosis Date  . Anemia    "since childhood" denies Sickle cell history  . Blood in stool   . Hypertension   . Left ventricular hypertrophy   . Mononucleosis     Past Surgical History:  Procedure Laterality Date  . NO PAST SURGERIES      Current Outpatient Medications  Medication Sig Dispense Refill  . amLODipine (NORVASC) 10 MG tablet Take 1 tablet (10 mg total) by mouth daily. 30 tablet 11  . carvedilol (COREG) 3.125 MG tablet Take 1 tablet (3.125 mg total) by mouth 2 (two) times daily. 180 tablet 1  . DULoxetine (CYMBALTA) 30 MG capsule Take 1 capsule (30 mg total) by mouth daily for 7 days, THEN 2 capsules (60 mg total) daily for 23 days. 53  capsule 0  . famotidine (PEPCID) 20 MG tablet Take 1 tablet (20 mg total) by mouth 2 (two) times daily. 90 tablet 3   No current facility-administered medications for this visit.     Allergies as of 05/29/2017  . (No Known Allergies)    Family History  Problem Relation Age of Onset  . Hypertension Other   . Diabetes Other   . Hypertension Mother   . Lupus Mother   . Hypertension Father   . Diabetes Father   . Hypertension Sister   . Mental illness Brother     Social History   Socioeconomic History  . Marital status: Single    Spouse name: Not on file  . Number of children: Not on file  . Years of education: Not on file  . Highest education level: Not on file  Social Needs  . Financial resource strain: Not on file  . Food insecurity - worry: Not on file  . Food insecurity - inability: Not on file  . Transportation needs - medical: Not on file  . Transportation needs - non-medical: Not on file  Occupational History  . Occupation: Art gallery manager  Tobacco Use  . Smoking status: Never Smoker  . Smokeless tobacco: Never Used  Substance and Sexual Activity  . Alcohol use: Yes    Comment: Rare  . Drug use: Yes  Types: Marijuana  . Sexual activity: Yes    Partners: Female  Other Topics Concern  . 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    College educated .   Routine exercise.   Takes a daily vitamin.   Uses herbal remedies.   Smoke alarm in the home. Her seatbelt.   Feels safe in his relationships.    Review of Systems:    Constitutional: No weight loss, fever or chills Skin: No rash Cardiovascular: +chest pain  Respiratory: No SOB  Gastrointestinal: See HPI and otherwise negative Genitourinary: No dysuria or change in urinary frequency Neurological: No headache Musculoskeletal: No new muscle or joint pain Hematologic: No bruising Psychiatric: No history of depression or anxiety   Physical Exam:  Vital signs: BP 116/64 (BP  Location: Left Arm, Patient Position: Sitting, Cuff Size: Normal)   Pulse (!) 56   Ht 6' (1.829 m) Comment: height measured without shoes  Wt 180 lb (81.6 kg)   BMI 24.41 kg/m    Constitutional:   Pleasant AA male appears to be in NAD, Well developed, Well nourished, alert and cooperative Head:  Normocephalic and atraumatic. Eyes:   PEERL, EOMI. No icterus. Conjunctiva pink. Ears:  Normal auditory acuity. Neck:  Supple Throat: Oral cavity and pharynx without inflammation, swelling or lesion.  Respiratory: Respirations even and unlabored. Lungs clear to auscultation bilaterally.   No wheezes, crackles, or rhonchi.  Cardiovascular: Normal S1, S2. No MRG. Regular rate and rhythm. No peripheral edema, cyanosis or pallor.  Gastrointestinal:  Soft, nondistended, nontender. No rebound or guarding. Normal bowel sounds. No appreciable masses or hepatomegaly. Rectal:  External: hemorrhoid and hemorrhoid tag, some ttp; internal: some ttp; Anoscopy: Grade I int hemorrhoids with stigmata of recent bleeding Msk:  Symmetrical without gross deformities. Without edema, no deformity or joint abnormality.  Neurologic:  Alert and  oriented x4;  grossly normal neurologically.  Skin:   Dry and intact without significant lesions or rashes. Psychiatric: Demonstrates good judgement and reason without abnormal affect or behaviors.  RELEVANT LABS AND IMAGING: CBC    Component Value Date/Time   WBC 6.9 04/28/2017 1154   RBC 4.72 04/28/2017 1154   HGB 13.7 04/28/2017 1154   HCT 39.8 04/28/2017 1154   PLT 252 04/28/2017 1154   MCV 84.3 04/28/2017 1154   MCH 29.0 04/28/2017 1154   MCHC 34.4 04/28/2017 1154   RDW 12.3 04/28/2017 1154   LYMPHSABS 1,843 04/23/2017 1200   MONOABS 1.8 (H) 01/20/2017 2145   EOSABS 61 04/23/2017 1200   BASOSABS 22 04/23/2017 1200    CMP     Component Value Date/Time   NA 138 04/28/2017 1154   K 3.7 04/28/2017 1154   CL 104 04/28/2017 1154   CO2 23 04/28/2017 1154   GLUCOSE  96 04/28/2017 1154   BUN 9 04/28/2017 1154   CREATININE 0.95 04/28/2017 1154   CALCIUM 9.6 04/28/2017 1154   PROT 7.7 01/20/2017 2145   ALBUMIN 4.4 01/20/2017 2145   AST 21 01/20/2017 2145   ALT 20 01/20/2017 2145   ALKPHOS 67 01/20/2017 2145   BILITOT 0.3 01/20/2017 2145   GFRNONAA >60 04/28/2017 1154   GFRAA >60 04/28/2017 1154    Assessment: 1.  Rectal bleeding: For the past 2 weeks, "dripping bright red blood" after bowel movements twice a day 2.  External and internal hemorrhoids: Seen at time of exam today, likely source of bleeding above 3.  Atypical chest pain: Cardiac workup has  so far been negative, patient still believes this occurs when his blood pressures is up after and also after exercise, he is wanting a stress test, continues to follow with cardiology  Plan: 1.  Prescribed Hydrocortisone ointment to be applied to the outside of his rectum and to a suppository twice daily for the next week, patient can repeat for a week if needed. 2.  Discussed if he continues to see bright red blood in his stool after use of Hydrocortisone as above with suppositories as directed he can call and let us know and would recommend a colonoscopy. 3.  Prescribed Omeprazole 40 mg daily, 30-60 minutes before breakfast for 1 month.  Discussed with patient that if he does this higher strength medication for longer period of time and continues with chest pain we can likely remove gastritis as possible cause.  He should continue to follow with his cardiologist as directed. 4.  Recommend patient start Colace once daily for the next few weeks. 5.  Patient to follow in clinic as needed with Dr. Russella DarStark or myself.  Hyacinth MeekerJennifer Chas Axel, PA-C [facility] 05/29/2017, 9:24 AM  Cc: Felix PaciniKuneff, Renee A, DO

## 2017-05-29 NOTE — Patient Instructions (Signed)
If you are age 25 or older, your body mass index should be between 23-30. Your Body mass index is 24.41 kg/m. If this is out of the aforementioned range listed, please consider follow up with your Primary Care Provider.  If you are age 25 or younger, your body mass index should be between 19-25. Your Body mass index is 24.41 kg/m. If this is out of the aformentioned range listed, please consider follow up with your Primary Care Provider.   We have sent the following medications to your pharmacy for you to pick up at your convenience: Hydrocortisone Cream twice a day to the outside of rectum and apply cream to glycerin suppository and insert into rectum for seven days. Repeat if necessary.  Omeprazole 40mg  take one capsule once a day before breakfast for one month.  Please purchase the following medications over the counter and take as directed: Colace Stool Softener once daily.

## 2017-05-29 NOTE — Progress Notes (Signed)
Reviewed and agree with initial management plan.  Normon Pettijohn T. Agueda Houpt, MD FACG 

## 2017-06-01 ENCOUNTER — Telehealth: Payer: Self-pay | Admitting: Internal Medicine

## 2017-06-01 NOTE — Telephone Encounter (Signed)
That is fine with me.  Tamisha Nordstrom, MD [facility] Pager: [phone]  

## 2017-06-01 NOTE — Telephone Encounter (Signed)
OK 

## 2017-06-01 NOTE — Telephone Encounter (Signed)
New message    Patient would like to transfer services from Dr. Okey DupreEnd over to Dr. Antoine PocheHochrein. Please advise

## 2017-06-05 ENCOUNTER — Ambulatory Visit (HOSPITAL_COMMUNITY)
Admission: RE | Admit: 2017-06-05 | Discharge: 2017-06-05 | Disposition: A | Discharge status: Home / Self Care | Payer: 59 | Source: Ambulatory Visit | Attending: Cardiovascular Disease | Admitting: Cardiovascular Disease

## 2017-06-05 DIAGNOSIS — I10 Essential (primary) hypertension: Secondary | ICD-10-CM | POA: Insufficient documentation

## 2017-06-05 DIAGNOSIS — I998 Other disorder of circulatory system: Secondary | ICD-10-CM | POA: Insufficient documentation

## 2017-06-06 NOTE — Telephone Encounter (Signed)
Left a message to call back.

## 2017-06-06 NOTE — Telephone Encounter (Signed)
Patient has been made aware. Scheduling will be notified to get an appointment set up with Dr. Antoine PocheHochrein.

## 2017-06-08 ENCOUNTER — Ambulatory Visit: Payer: 59 | Admitting: Cardiology

## 2017-06-15 ENCOUNTER — Ambulatory Visit: Payer: Self-pay | Admitting: Family Medicine

## 2017-06-19 ENCOUNTER — Encounter: Payer: Self-pay | Admitting: Emergency Medicine

## 2017-06-19 ENCOUNTER — Ambulatory Visit (INDEPENDENT_AMBULATORY_CARE_PROVIDER_SITE_OTHER): Payer: 59 | Admitting: Emergency Medicine

## 2017-06-19 VITALS — BP 130/76 | HR 67

## 2017-06-19 DIAGNOSIS — R0789 Other chest pain: Secondary | ICD-10-CM | POA: Diagnosis not present

## 2017-06-19 DIAGNOSIS — R0600 Dyspnea, unspecified: Secondary | ICD-10-CM | POA: Diagnosis not present

## 2017-06-19 NOTE — Progress Notes (Signed)
Subjective:    Patient ID: Elizar Alpern, male    DOB: 29-Aug-1990, 27 y.o.   MRN: [id number]  HPI 27 yo man, minimal tobacco exposure, hx anemia, anxiety, HTN, hemorrhoids. He was well until Nov 2018, had L sided tightness and pain after smoking. Maybe some associated palpitation.  Persisted until he took his amlodipine next day. He noted that it would return when he exercised, playing basketball.  Can be exacerbated by singing loud speaking. He has seen cardiology with reassuring eval as below. Also working with GI. He did a two week trial of omeprazole without any change. He notes that he can take his Fe pill and make it better - unclear why. At some point, dx of fibromyalgia has been entertained - not currently on meds for this.   Autoimmune labs > normal ACE, ANA, ESR, CRP Urine metanephrines > normal.    Review of Systems  HENT: Positive for dental problem, ear pain, postnasal drip, sinus pressure and sore throat.   Eyes: Positive for itching.  Respiratory: Positive for shortness of breath.   Cardiovascular: Positive for palpitations.  Genitourinary: Positive for dysuria.  Allergic/Immunologic: Negative.   Psychiatric/Behavioral: The patient is nervous/anxious.    Coronary CT chest 04/24/17 >> no evidence of CAD, no significant infiltrates noted on parenchymal windows (apices not visualized) Echocardiogram 02/28/17 >> normal LV function and diastolic function, trivial aortic insufficiency normal right-sided function and normal PAP  CXR 01/30/17 >> normal.    Past Medical History:  Diagnosis Date  . Anemia    "since childhood" denies Sickle cell history  . Anxiety   . Blood in stool   . Hypertension   . Left ventricular hypertrophy   . Mononucleosis      Family History  Problem Relation Age of Onset  . Hypertension Mother   . Lupus Mother   . Hypertension Father   . Diabetes Father   . Hypertension Sister   . Mental illness Brother      Social History    Socioeconomic History  . Marital status: Single    Spouse name: Not on file  . Number of children: 0  . Years of education: Not on file  . Highest education level: Not on file  Occupational History  . Occupation: Glass blower/designer  . Financial resource strain: Not on file  . Food insecurity:    Worry: Not on file    Inability: Not on file  . Transportation needs:    Medical: Not on file    Non-medical: Not on file  Tobacco Use  . Smoking status: Former Smoker    Last attempt to quit: 05/29/2016    Years since quitting: 1.0  . Smokeless tobacco: Never Used  Substance and Sexual Activity  . Alcohol use: No    Frequency: Never  . Drug use: No    Types: Marijuana    Comment: college years  . Sexual activity: Yes    Partners: Female  Lifestyle  . Physical activity:    Days per week: Not on file    Minutes per session: Not on file  . Stress: Not on file  Relationships  . Social connections:    Talks on phone: Not on file    Gets together: Not on file    Attends religious service: Not on file    Active member of club or organization: Not on file    Attends meetings of clubs or organizations: Not on file    Relationship  status: Not on file  . Intimate partner violence:    Fear of current or ex partner: Not on file    Emotionally abused: Not on file    Physically abused: Not on file    Forced sexual activity: Not on file  Other Topics Concern  . 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    College educated .   Routine exercise.   Takes a daily vitamin.   Uses herbal remedies.   Smoke alarm in the home. Her seatbelt.   Feels safe in his relationships.  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   No Known Allergies   Outpatient Medications Prior to Visit  Medication Sig Dispense Refill  . amLODipine (NORVASC) 10 MG tablet Take 1 tablet (10 mg total) by mouth daily. 30 tablet 11  .  carvedilol (COREG) 3.125 MG tablet Take 1 tablet (3.125 mg total) by mouth 2 (two) times daily. 180 tablet 1  . hydrocortisone (ANUSOL-HC) 2.5 % rectal cream Place 1 application rectally 2 (two) times daily. 30 g 1  . omeprazole (PRILOSEC) 40 MG capsule Take 1 capsule (40 mg total) by mouth daily. 30 capsule 0   No facility-administered medications prior to visit.         Objective:   Physical Exam Vitals:   06/19/17 1043  BP: 130/76  Pulse: 67  SpO2: 98%   Gen: Pleasant, thin, in no distress,  normal affect  ENT: No lesions,  mouth clear,  oropharynx clear, no postnasal drip  Neck: No JVD, no stridor  Lungs: No use of accessory muscles, clear without rales or rhonchi  Cardiovascular: RRR, heart sounds normal, no murmur or gallops, no peripheral edema  Musculoskeletal: No deformities, no cyanosis or clubbing, no tenderness palp costal margin  Neuro: alert, non focal  Skin: Warm, no lesions or rashes      Assessment & Plan:  Chest pain Fleeting pleuritic CP, etiology unclear. Agree with his previous evals that there may be a GERD component, but he failed omeprazole. The cards eval has been reassuring. Feel that we need to r/o obstructive lung disease, thromboembolic disease. He was concerned about cost of CT chest, so I will order v/q scan. He describes palpitations,  may needs a CPST to better eval for any cardiopulm cause for his sx, see if we can reproduce the sx.   Baltazar Apo, MD, PhD 06/19/2017, 12:34 PM [facility] [phone] or if no answer [phone]

## 2017-06-19 NOTE — Assessment & Plan Note (Signed)
Fleeting pleuritic CP, etiology unclear. Agree with his previous evals that there may be a GERD component, but he failed omeprazole. The cards eval has been reassuring. Feel that we need to r/o obstructive lung disease, thromboembolic disease. He was concerned about cost of CT chest, so I will order v/q scan. He describes palpitations,  may needs a CPST to better eval for any cardiopulm cause for his sx, see if we can reproduce the sx.

## 2017-06-19 NOTE — Patient Instructions (Signed)
We will perform a ventilation / perfusion scan  We will perform full pulmonary function testing at your next visit Follow with Dr Delton CoombesByrum next available with PFT on the same day

## 2017-06-22 ENCOUNTER — Encounter: Payer: Self-pay | Admitting: *Deleted

## 2017-06-22 ENCOUNTER — Ambulatory Visit: Payer: 59 | Admitting: Family Medicine

## 2017-06-22 DIAGNOSIS — Z0289 Encounter for other administrative examinations: Secondary | ICD-10-CM

## 2017-06-26 ENCOUNTER — Ambulatory Visit (HOSPITAL_COMMUNITY): Admission: RE | Admit: 2017-06-26 | Payer: 59 | Source: Ambulatory Visit

## 2017-06-26 ENCOUNTER — Ambulatory Visit (HOSPITAL_COMMUNITY): Payer: 59

## 2017-06-30 NOTE — Progress Notes (Signed)
Cardiology Office Note   Date:  07/02/2017   ID:  Vincent Ibarra, Vincent Ibarra June 07, 1990, MRN [id number]  PCP:  Natalia Leatherwood, DO  Cardiologist:   Yvonne Kendall, MD   Chief Complaint  Patient presents with  . Palpitations      History of Present Illness: Vincent Ibarra is a 27 y.o. male who presents for follow up of hypertension and trivial aortic regurgitation.  He has had chest pain and palpitations.  Coronary CTA was normal.  The echocardiogram did not suggest LVH or other abnormalities.  He has had high blood pressure since he was 20 and he has a family history of hypertension.  He reports he exercises routinely.  He gets on a treadmill.  He lifts weights.  He plays basketball.  However, after basketball his heart rate will often be very elevated and he will have to stop playing.  He says it will stay high for 30 minutes when it should be coming down.  This is his most exerting activity.  He has trouble falling asleep at night.  He has some chest pain at night.  He says occasionally his blood pressure drops at home.  His heart rate might go down as well.  None of these symptoms seem to happen together.  He has some sweating on his forehead at night.  He wanted a second opinion about all of this.  Past Medical History:  Diagnosis Date  . Anemia    "since childhood" denies Sickle cell history  . Anxiety   . Blood in stool   . Hypertension   . Left ventricular hypertrophy   . Mononucleosis     Past Surgical History:  Procedure Laterality Date  . NO PAST SURGERIES       Current Outpatient Medications  Medication Sig Dispense Refill  . amLODipine (NORVASC) 10 MG tablet Take 1 tablet (10 mg total) by mouth daily. 30 tablet 11  . carvedilol (COREG) 3.125 MG tablet Take 1 tablet (3.125 mg total) by mouth 2 (two) times daily. 180 tablet 1   No current facility-administered medications for this visit.     Allergies:   Patient has no known allergies.    ROS:   Please see the history of present illness.   Otherwise, review of systems are positive for none.   All other systems are reviewed and negative.    PHYSICAL EXAM: VS:  BP 125/73   Pulse (!) 55   Ht 6' (1.829 m)   Wt 175 lb 3.2 oz (79.5 kg)   BMI 23.76 kg/m  , BMI Body mass index is 23.76 kg/m. GENERAL:  Well appearing NECK:  No jugular venous distention, waveform within normal limits, carotid upstroke brisk and symmetric, no bruits, no thyromegaly LUNGS:  Clear to auscultation bilaterally CHEST:  Unremarkable HEART:  PMI not displaced or sustained,S1 and S2 within normal limits, no S3, no S4, no clicks, no rubs, no murmurs ABD:  Flat, positive bowel sounds normal in frequency in pitch, no bruits, no rebound, no guarding, no midline pulsatile mass, no hepatomegaly, no splenomegaly EXT:  2 plus pulses throughout, no edema, no cyanosis no clubbing   EKG:  EKG is not ordered today.    Recent Labs: 01/20/2017: ALT 20 04/23/2017: TSH 1.26 04/28/2017: BUN 9; Creatinine, Ser 0.95; Hemoglobin 13.7; Platelets 252; Potassium 3.7; Sodium 138    Lipid Panel No results found for: CHOL, TRIG, HDL, CHOLHDL, VLDL, LDLCALC, LDLDIRECT    Wt Readings from Last 3  Encounters:  07/02/17 175 lb 3.2 oz (79.5 kg)  05/29/17 180 lb (81.6 kg)  05/11/17 174 lb (78.9 kg)      Other studies Reviewed: Additional studies/ records that were reviewed today include: CT, echo, labs. Review of the above records demonstrates:  Please see elsewhere in the note.     ASSESSMENT AND PLAN:  Chest pain Chest pain I agree is noncardiac.  No further cardiac workup is planned.  This was trivial.  Aortic regurgitation This was trivial.  His echo was otherwise unremarkable.  No further imaging is indicated.  Hypertension His BP is controlled on the meds as listed and he tolerates these.  No change in therapy.   He has had a work up for secondary causes.   Palpitations I have suggested Alive Cor and a six week  follow up.  TSH was normal this year.     Current medicines are reviewed at length with the patient today.  The patient does not have concerns regarding medicines.  The following changes have been made:  no change  Labs/ tests ordered today include: None No orders of the defined types were placed in this encounter.    Disposition:   FU with me in six weeks.      Signed, Rollene RotundaJames Asaph Serena, MD  07/02/2017 9:14 AM    [facility]

## 2017-07-02 ENCOUNTER — Ambulatory Visit (INDEPENDENT_AMBULATORY_CARE_PROVIDER_SITE_OTHER): Payer: 59 | Admitting: Cardiology

## 2017-07-02 ENCOUNTER — Encounter: Payer: Self-pay | Admitting: Cardiology

## 2017-07-02 VITALS — BP 125/73 | HR 55 | Ht 72.0 in | Wt 175.2 lb

## 2017-07-02 DIAGNOSIS — I10 Essential (primary) hypertension: Secondary | ICD-10-CM

## 2017-07-02 DIAGNOSIS — R002 Palpitations: Secondary | ICD-10-CM | POA: Diagnosis not present

## 2017-07-02 NOTE — Patient Instructions (Signed)
Medication Instructions:  Continue current medications   If you need a refill on your cardiac medications before your next appointment, please call your pharmacy.  Labwork: None Ordered   Testing/Procedures: None Ordered  Follow-Up: Your physician wants you to follow-up in: 6 Weeks.   [facility]    Thank you for choosing [facility]!!

## 2017-07-05 ENCOUNTER — Ambulatory Visit (HOSPITAL_COMMUNITY): Admission: RE | Admit: 2017-07-05 | Payer: 59 | Source: Ambulatory Visit

## 2017-07-05 ENCOUNTER — Encounter (HOSPITAL_COMMUNITY): Admission: RE | Admit: 2017-07-05 | Payer: 59 | Source: Ambulatory Visit

## 2017-07-06 ENCOUNTER — Ambulatory Visit (INDEPENDENT_AMBULATORY_CARE_PROVIDER_SITE_OTHER): Payer: 59 | Admitting: Family Medicine

## 2017-07-06 ENCOUNTER — Encounter: Payer: Self-pay | Admitting: Family Medicine

## 2017-07-06 VITALS — BP 136/75 | HR 66 | Temp 98.3°F | Ht 72.0 in | Wt 178.6 lb

## 2017-07-06 DIAGNOSIS — K625 Hemorrhage of anus and rectum: Secondary | ICD-10-CM | POA: Diagnosis not present

## 2017-07-06 DIAGNOSIS — I10 Essential (primary) hypertension: Secondary | ICD-10-CM

## 2017-07-06 DIAGNOSIS — F419 Anxiety disorder, unspecified: Secondary | ICD-10-CM

## 2017-07-06 DIAGNOSIS — R61 Generalized hyperhidrosis: Secondary | ICD-10-CM

## 2017-07-06 DIAGNOSIS — J3489 Other specified disorders of nose and nasal sinuses: Secondary | ICD-10-CM | POA: Diagnosis not present

## 2017-07-06 DIAGNOSIS — R002 Palpitations: Secondary | ICD-10-CM | POA: Diagnosis not present

## 2017-07-06 MED ORDER — DULOXETINE HCL 30 MG PO CPEP: 30.0000 mg | ORAL_CAPSULE | Freq: Every day | ORAL | 0 refills | Status: DC

## 2017-07-06 MED ORDER — AMLODIPINE BESYLATE 10 MG PO TABS: 10.0000 mg | ORAL_TABLET | Freq: Every day | ORAL | 2 refills | Status: DC

## 2017-07-06 MED ORDER — CARVEDILOL 3.125 MG PO TABS: 3.1250 mg | ORAL_TABLET | Freq: Two times a day (BID) | ORAL | 1 refills | Status: DC

## 2017-07-06 NOTE — Patient Instructions (Addendum)
ALIGN probiotic or the one given today, either are GREAT for you.  Miralax taper (1/4 cap to 1 cap a day in 8 ounces of water to help stool be formed/soft, but not watery stools.  Increase water to at least 80 ounces a day--> can be part SUGAR FREE Gatorade.  Take one iron pill a day, make sure to take the miralx or colace when taking daily iron--> it can cause constipation. If continue to have blood with stools please follow with GI.  I have refilled your medications today. Start Cymbalta.  Follow up in 3 months on Cymbalta, sooner if needed. May need higher dose, this is a very low dose.   I will place the referral back to Dr. Lazarus SalinesWolicki to discuss (ENT).

## 2017-07-06 NOTE — Progress Notes (Signed)
Vincent Ibarra , 01/15/1991, 27 y.o., male MRN: [id number] Patient Care Team    Relationship Specialty Notifications Start Ibarra  Vincent Ibarra, Carisa Backhaus A, DO PCP - General Family Medicine  04/23/17   Vincent Ibarra, Christopher, MD PCP - Cardiology Cardiology Admissions 02/22/17   Vincent Ibarra, Karol, MD Consulting Physician Otolaryngology  04/23/17     Chief Complaint  Patient presents with  . Follow-up    Fibromyalgia     Subjective: Vincent Ibarra is a .27 y.o. male since today for discussion on his multiple complaints and recent office visits with specialist.   Since last visit patient has seen a new cardiologist which is moving forward with monitoring through ALIVE Cor, which he brings with him today.  Overall he is doing better.  He still endorses night sweats, occasional palpitations.  He has been seen by pulmonology who did not feel he was at high risk for sleep apnea and have been moving forward with further evaluation with a VQ scan.  He is following with gastroenterology secondary for his rectal bleeding.  He reports it has greatly improved however he still has mild rectal bleeding.  Is not been taking his iron.   Depression screen PHQ 2/9 04/23/2017  Decreased Interest 0  Down, Depressed, Hopeless 0  PHQ - 2 Score 0    No Known Allergies Social History   Tobacco Use  . Smoking status: Former Smoker    Last attempt to quit: 05/29/2016    Years since quitting: 1.1  . Smokeless tobacco: Never Used  Substance Use Topics  . Alcohol use: No    Frequency: Never   Past Medical History:  Diagnosis Date  . Anemia    "since childhood" denies Sickle cell history  . Anxiety   . Blood in stool   . Hypertension   . Left ventricular hypertrophy   . Mononucleosis    Past Surgical History:  Procedure Laterality Date  . NO PAST SURGERIES     Family History  Problem Relation Age of Onset  . Hypertension Mother   . Lupus Mother   . Hypertension Father   . Diabetes Father   .  Hypertension Sister   . Mental illness Brother    Allergies as of 07/06/2017   No Known Allergies     Medication List        Accurate as of 07/06/17  9:16 AM. Always use your most recent med list.          amLODipine 10 MG tablet Commonly known as:  NORVASC Take 1 tablet (10 mg total) by mouth daily.   carvedilol 3.125 MG tablet Commonly known as:  COREG Take 1 tablet (3.125 mg total) by mouth 2 (two) times daily.       All past medical history, surgical history, allergies, family history, immunizations andmedications were updated in the EMR today and reviewed under the history and medication portions of their EMR.     ROS: Negative, with the exception of above mentioned in HPI Objective:  BP 136/75 (BP Location: Left Arm, Patient Position: Sitting, Cuff Size: Normal)   Pulse 66   Temp 98.3 F (36.8 C) (Oral)   Ht 6' (1.829 m)   Wt 178 lb 9.6 oz (81 kg)   SpO2 98%   BMI 24.22 kg/m  Body mass index is 24.22 kg/m.  Gen: Afebrile. No acute distress.  No acute distress, nontoxic in appearance, well-developed, well-nourished, African-American male.  Physically fit. HENT: AT. [name].  MMM. Eyes:Pupils Equal  Round Reactive to light, Extraocular movements intact,  Conjunctiva without redness, discharge or icterus. Neck/lymp/endocrine: Supple, no lymphadenopathy, no thyromegaly CV: RRR no murmur, no edema, +2/4 P posterior tibialis pulses Chest: CTAB, no wheeze or crackles Skin: no rashes, purpura or petechiae.  Neuro: Normal gait. PERLA. EOMi. Alert. Oriented x3  Psych: Anxious.  Normal affect, dress and demeanor. Normal speech. Normal thought content and judgment.  No exam data present No results found. No results found for this or any previous visit (from the past 24 hour(s)).  Assessment/Plan: [name] is a 27 y.o. male present for OV for  Hypertension, unspecified type/palpitations Controlled.  Refills on amlodipine 10 mg daily and Coreg 3.125 twice daily  provided today. Exercise routinely.  Low-salt diet. -Follow-up every 6 months on hypertension.  Rectal bleeding Has recent history of rectal bleeding, followed by gastroenterology.  Reports improved symptoms with use of Anusol cream and suppositories however bleeding does persist minimally.  - Advised him to restart his iron supplementation daily, with use of MiraLAX and/or Colace. - Discussed tapering MiraLAX for daily keep stools soft but not loose. - Start probiotics, samples given him today. -Patient was advised to follow-up with gastroenterology as instructed, sooner if bleeding persist.  Patient reports understanding.  Nasal lesion Patient reports nasal lesion has returned.  He would like to be referred back to ENT.  Told him he likely does not need a referral for this, I will place him back to his prior ENT to generate a appointment for him.  He is still having night sweats, concerns for some sarcoid/autoimmune disorders as potential cause. - Ambulatory referral to ENT  Anxiety Patient has complained of multiple musculoskeletal complaints, which today he states mostly have resolved.  Cardiac, pulmonary, gastroenterology, ENT and neurological complaints and a otherwise healthy 27 year old male.  Some of his complaints may have organic cause, however I feel his anxiety surrounding his health issues may be playing a role.  Discussed this with him today, and he again is agreeable to try the Cymbalta 30 mg daily. He did not start it when prescribed in the past.  Chronic night sweats Uncertain etiology of chronic night sweats.  Patient is under the care of cardiology, gastroenterology, ENT and pulmonology to further investigate.  Negative workup for autoimmune disorders, sarcoid or inflammatory disorders by lab test.. Patient's glucose is been normal.     Reviewed expectations re: course of current medical issues.  Discussed self-management of symptoms.  Outlined signs and symptoms  indicating need for more acute intervention.  Patient verbalized understanding and all questions were answered.  Patient received an After-Visit Summary.    No orders of the defined types were placed in this encounter.    Note is dictated utilizing voice recognition software. Although note has been proof read prior to signing, occasional typographical errors still can be missed. If any questions arise, please do not hesitate to call for verification.   electronically signed by:  Felix Pacini, DO  [facility] [place]

## 2017-07-10 ENCOUNTER — Ambulatory Visit: Payer: 59 | Admitting: Emergency Medicine

## 2017-07-18 DIAGNOSIS — H9203 Otalgia, bilateral: Secondary | ICD-10-CM | POA: Insufficient documentation

## 2017-07-27 ENCOUNTER — Ambulatory Visit: Payer: 59 | Admitting: Physician Assistant

## 2017-07-31 ENCOUNTER — Ambulatory Visit (INDEPENDENT_AMBULATORY_CARE_PROVIDER_SITE_OTHER): Payer: 59 | Admitting: Emergency Medicine

## 2017-07-31 DIAGNOSIS — R0789 Other chest pain: Secondary | ICD-10-CM

## 2017-07-31 DIAGNOSIS — R0600 Dyspnea, unspecified: Secondary | ICD-10-CM

## 2017-07-31 LAB — PULMONARY FUNCTION TEST
DL/VA % PRED: 99 %
DL/VA: 4.79 ml/min/mmHg/L
DLCO unc % pred: 106 %
DLCO unc: 37.22 ml/min/mmHg
FEF 25-75 PRE: 5.27 L/s
FEF2575-%PRED-PRE: 114 %
FEV1-%PRED-PRE: 109 %
FEV1-PRE: 4.57 L
FEV1FVC-%Pred-Pre: 105 %
FEV6-%PRED-PRE: 103 %
FEV6-Pre: 5.1 L
FEV6FVC-%PRED-PRE: 101 %
FVC-%Pred-Pre: 102 %
FVC-Pre: 5.1 L
Pre FEV1/FVC ratio: 89 %
Pre FEV6/FVC Ratio: 100 %
RV % PRED: 178 %
RV: 2.89 L
TLC % pred: 106 %
TLC: 7.75 L

## 2017-07-31 NOTE — Progress Notes (Addendum)
Patient completed part pre-spiro, DLCO and pleth today. Patient did have hard time with pre-spiro blasting air out for period of time. Patient was given 4 puffs of Xopenex, and patient stated that his heart was hurting and did not want to continue the test. Checked his vitals, and his O2 was 99% and Heart rate 65.  Made RB aware of the situation, he encouraged the patient complete the test. Made my supervisor Robynn Pane and TW) aware of situation, that patient is refusing to continue the PFT today.  Patient states he is done, and refused to continue the PFT today.

## 2017-08-02 ENCOUNTER — Telehealth: Payer: Self-pay | Admitting: Cardiology

## 2017-08-02 ENCOUNTER — Ambulatory Visit: Payer: 59 | Admitting: Adult Health

## 2017-08-02 NOTE — Telephone Encounter (Signed)
New Message    Pt states he is calling to get the reference # for his live core cardiac to send ekgs to his doctor. Please call

## 2017-08-02 NOTE — Telephone Encounter (Signed)
Returned call to patient, advised that there should be a way to turn EKG into PDF and send via MyChart.    Advised patient to reach out to customer support for additional help.

## 2017-08-03 ENCOUNTER — Encounter (HOSPITAL_COMMUNITY)
Admission: RE | Admit: 2017-08-03 | Discharge: 2017-08-03 | Disposition: A | Discharge status: Home / Self Care | Payer: 59 | Source: Ambulatory Visit | Attending: Emergency Medicine | Admitting: Emergency Medicine

## 2017-08-03 ENCOUNTER — Ambulatory Visit (HOSPITAL_COMMUNITY)
Admission: RE | Admit: 2017-08-03 | Discharge: 2017-08-03 | Disposition: A | Discharge status: Home / Self Care | Payer: 59 | Source: Ambulatory Visit | Attending: Emergency Medicine | Admitting: Emergency Medicine

## 2017-08-03 DIAGNOSIS — R0600 Dyspnea, unspecified: Secondary | ICD-10-CM | POA: Insufficient documentation

## 2017-08-03 DIAGNOSIS — R0789 Other chest pain: Secondary | ICD-10-CM | POA: Diagnosis present

## 2017-08-03 MED ORDER — TECHNETIUM TO 99M ALBUMIN AGGREGATED
4.3500 | Freq: Once | INTRAVENOUS | Status: AC | PRN
  Administered 2017-08-03: 4.35 via INTRAVENOUS

## 2017-08-03 MED ORDER — TECHNETIUM TC 99M DIETHYLENETRIAME-PENTAACETIC ACID
31.1000 | Freq: Once | INTRAVENOUS | Status: AC | PRN
  Administered 2017-08-03: 31.1 via RESPIRATORY_TRACT

## 2017-08-13 ENCOUNTER — Ambulatory Visit: Payer: 59 | Admitting: Internal Medicine

## 2017-08-16 ENCOUNTER — Encounter: Payer: Self-pay | Admitting: Physician Assistant

## 2017-08-16 ENCOUNTER — Ambulatory Visit (INDEPENDENT_AMBULATORY_CARE_PROVIDER_SITE_OTHER): Payer: 59 | Admitting: Physician Assistant

## 2017-08-16 VITALS — BP 118/70 | HR 56 | Ht 72.0 in | Wt 181.2 lb

## 2017-08-16 DIAGNOSIS — R0789 Other chest pain: Secondary | ICD-10-CM | POA: Diagnosis not present

## 2017-08-16 DIAGNOSIS — K625 Hemorrhage of anus and rectum: Secondary | ICD-10-CM

## 2017-08-16 NOTE — Patient Instructions (Signed)
If you are age 25 or older, your body mass index should be between 23-30. Your Body mass index is 24.58 kg/m. If this is out of the aforementioned range listed, please consider follow up with your Primary Care Provider.  If you are age 25 or younger, your body mass index should be between 19-25. Your Body mass index is 24.58 kg/m. If this is out of the aformentioned range listed, please consider follow up with your Primary Care Provider.   Patient needs to have endo/colon procedure.  Available schedule does not work with patients work schedule.  Patient will be placed  on Recall after August.  Patty, RN will contact you regarding an appointment for this procedure.  You are being referred to Rheumatology. We will contact you with an appointment as soon as we hear back from the Rheumatology Dept.  Thank you for choosing me and [facility].   Hyacinth MeekerJennifer Lemmon, PA-C

## 2017-08-16 NOTE — Progress Notes (Signed)
Reviewed and agree with initial management plan.  Eden Rho T. Bridgitte Felicetti, MD FACG 

## 2017-08-16 NOTE — Progress Notes (Signed)
Chief Complaint: Follow-up blood in stool and rectal pain  HPI:    Vincent Ibarra is a 27 year old African-American male with a past medical history as listed below, who returns clinic today for follow-up of his blood in stool and rectal pain.    05/29/2017 office visit described 2 weeks of bright red blood dripping in the toilet with itching and rectal fullness, also chest pain following with cardiology thought this may be related to reflux.  At that time prescribed hydrocortisone ointment to be applied to the outside of his rectum and to suppository twice daily for the next week, repeat for a week if needed.  We discussed that he continues the right red blood in his stool after use of the suppositories and would recommend a colonoscopy.  Also prescribed omeprazole 40 mg daily for his noncardiac chest pain.       Today, describes continuing with bright red blood in his stool off and on, explains this is typically with a bowel movement and can sometimes still fill the toilet.  This is no different after using Hydrocortisone suppositories twice daily for 2 weeks.  Now also experiencing "terrible gas".  Denies a change in bowel habits, still reporting two regular soft solid stools per day.    Continues to experience atypical chest pain, no change after Omeprazole for 2 weeks at last visit.  Continues to follow with cardiology who has run multiple tests all of which have been normal.    Does ask questions today regarding possibility of having an autoimmune disorder.    Denies fever, chills, weight loss, anorexia, nausea or vomiting.  Past Medical History:  Diagnosis Date  . Anemia    "since childhood" denies Sickle cell history  . Anxiety   . Blood in stool   . Hypertension   . Left ventricular hypertrophy   . Mononucleosis   . Snoring 2019   pt reported apnea sx, seen at pulm and was not felt to be sleep apnea.     Past Surgical History:  Procedure Laterality Date  . NO PAST SURGERIES       Current Outpatient Medications  Medication Sig Dispense Refill  . amLODipine (NORVASC) 10 MG tablet Take 1 tablet (10 mg total) by mouth daily. 90 tablet 2  . carvedilol (COREG) 3.125 MG tablet Take 1 tablet (3.125 mg total) by mouth 2 (two) times daily. 180 tablet 1  . DULoxetine (CYMBALTA) 30 MG capsule Take 1 capsule (30 mg total) by mouth daily. 90 capsule 0   No current facility-administered medications for this visit.     Allergies as of 08/16/2017  . (No Known Allergies)    Family History  Problem Relation Age of Onset  . Hypertension Mother   . Lupus Mother   . Hypertension Father   . Diabetes Father   . Hypertension Sister   . Mental illness Brother     Social History   Socioeconomic History  . Marital status: Single    Spouse name: Not on file  . Number of children: 0  . Years of education: Not on file  . Highest education level: Not on file  Occupational History  . Occupation: Paediatric nursengineer  Social Needs  . Financial resource strain: Not on file  . Food insecurity:    Worry: Not on file    Inability: Not on file  . Transportation needs:    Medical: Not on file    Non-medical: Not on file  Tobacco Use  . Smoking status:  Former Smoker    Last attempt to quit: 05/29/2016    Years since quitting: 1.2  . Smokeless tobacco: Never Used  Substance and Sexual Activity  . Alcohol use: No    Frequency: Never  . Drug use: No    Types: Marijuana    Comment: college years  . Sexual activity: Yes    Partners: Female  Lifestyle  . Physical activity:    Days per week: Not on file    Minutes per session: Not on file  . Stress: Not on file  Relationships  . Social connections:    Talks on phone: Not on file    Gets together: Not on file    Attends religious service: Not on file    Active member of club or organization: Not on file    Attends meetings of clubs or organizations: Not on file    Relationship status: Not on file  . Intimate partner violence:     Fear of current or ex partner: Not on file    Emotionally abused: Not on file    Physically abused: Not on file    Forced sexual activity: Not on file  Other Topics Concern  . 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    College educated .   Routine exercise.   Takes a daily vitamin.   Uses herbal remedies.   Smoke alarm in the home. Her seatbelt.   Feels safe in his relationships.    Review of Systems:    Constitutional: No weight loss, fever or chills Cardiovascular: No chest pain Respiratory: No SOB Gastrointestinal: See HPI and otherwise negative   Physical Exam:  Vital signs: BP 118/70   Pulse (!) 56   Ht 6' (1.829 m)   Wt 181 lb 3.2 oz (82.2 kg)   BMI 24.58 kg/m    Constitutional:   Pleasant AA male appears to be in NAD, Well developed, Well nourished, alert and cooperative Respiratory: Respirations even and unlabored. Lungs clear to auscultation bilaterally.   No wheezes, crackles, or rhonchi.  Cardiovascular: Normal S1, S2. No MRG. Regular rate and rhythm. No peripheral edema, cyanosis or pallor.  Gastrointestinal:  Soft, nondistended, nontender. No rebound or guarding. Normal bowel sounds. No appreciable masses or hepatomegaly. Rectal:  Not performed.  Psychiatric: Demonstrates good judgement and reason without abnormal affect or behaviors.  No recent labs or imaging.  Assessment: 1. Rectal Bleeding: thought related to int hemorrhoids 05/29/17, no improvement after hydrocortisone suppositories twice daily x2 weeks, regular bowel movements; consider IBD versus hemorrhoids versus other 2. Atypical chest pain: No change after Omeprazole high-strength for 2 weeks, has seen cardiology who still has not found diagnosis, they still have concern in regards to possible GERD  Plan: 1.  Recommend patient schedule EGD and colonoscopy in [facility] with Dr. Russella Dar.  Did discuss risk, benefits, limitations and alternatives and patient agrees to proceed.   Patient was given all available appointment times and tells me he has no PTO at the moment to take time off.  He prefers to wait and be called at the beginning of August with available appointments at that time when he may be able to take a day off.  We will put him in recall for the beginning of August. 2.  Patient to continue following with pulmonologist and cardiologist. 3.  Referred patient to rheumatology for his concerns regarding autoimmune disease 4.  Patient to follow in clinic after procedures are scheduled  above with myself or Dr. Russella Dar as recommended.  Hyacinth Meeker, PA-C [facility] 08/16/2017, 9:46 AM  Cc: Felix Pacini A, DO

## 2017-08-20 ENCOUNTER — Ambulatory Visit: Payer: 59 | Admitting: Family Medicine

## 2017-08-20 DIAGNOSIS — Z0289 Encounter for other administrative examinations: Secondary | ICD-10-CM

## 2017-08-23 NOTE — Progress Notes (Signed)
Cardiology Office Note   Date:  08/24/2017   ID:  Arloa Koh, DOB 14-Oct-1990, MRN [id number]  PCP:  Natalia Leatherwood, DO  Cardiologist:   No primary care provider on file.   No chief complaint on file.     History of Present Illness: Abraham Entwistle is a 27 y.o. male who presents for follow up of hypertension and trivial aortic regurgitation.  He has had chest pain and palpitations.  Coronary CTA was normal.  The echocardiogram did not suggest LVH or other abnormalities.  He has had high blood pressure since he was 20 and he has a family history of hypertension.  He returns for evaluation of palpitations.   Since he was last seen he has continued to have multiple complaints including chest pain that is sharp.  HR that runs in the 70s or 80s after he is exercising.  He hears a heart beat in his head at times.  He has no syncope or presyncope.  I did review his Alive Cor and he had only NSR when he was having chest pain.     Past Medical History:  Diagnosis Date  . Anemia    "since childhood" denies Sickle cell history  . Anxiety   . Blood in stool   . Hypertension   . Left ventricular hypertrophy   . Mononucleosis   . Snoring 2019   pt reported apnea sx, seen at pulm and was not felt to be sleep apnea.     Past Surgical History:  Procedure Laterality Date  . NO PAST SURGERIES       Current Outpatient Medications  Medication Sig Dispense Refill  . amLODipine (NORVASC) 10 MG tablet Take 1 tablet (10 mg total) by mouth daily. 90 tablet 2  . carvedilol (COREG) 3.125 MG tablet Take 1 tablet (3.125 mg total) by mouth 2 (two) times daily. 180 tablet 1  . DULoxetine (CYMBALTA) 30 MG capsule Take 1 capsule (30 mg total) by mouth daily. 90 capsule 0   No current facility-administered medications for this visit.     Allergies:   Patient has no known allergies.    ROS:  Please see the history of present illness.   Otherwise, review of systems are positive for  nne.   All other systems are reviewed and negative.    PHYSICAL EXAM: VS:  BP 124/78   Pulse 79   Ht 6' (1.829 m)   Wt 184 lb 6.4 oz (83.6 kg)   SpO2 98%   BMI 25.01 kg/m  , BMI Body mass index is 25.01 kg/m.  GENERAL:  Well appearing NECK:  No jugular venous distention, waveform within normal limits, carotid upstroke brisk and symmetric, no bruits, no thyromegaly LUNGS:  Clear to auscultation bilaterally CHEST:  Unremarkable HEART:  PMI not displaced or sustained,S1 and S2 within normal limits, no S3, no S4, no clicks, no rubs, no murmurs ABD:  Flat, positive bowel sounds normal in frequency in pitch, no bruits, no rebound, no guarding, no midline pulsatile mass, no hepatomegaly, no splenomegaly EXT:  2 plus pulses throughout, no edema, no cyanosis no clubbing    EKG:  EKG not ordered today.    Recent Labs: 01/20/2017: ALT 20 04/23/2017: TSH 1.26 04/28/2017: BUN 9; Creatinine, Ser 0.95; Hemoglobin 13.7; Platelets 252; Potassium 3.7; Sodium 138    Lipid Panel No results found for: CHOL, TRIG, HDL, CHOLHDL, VLDL, LDLCALC, LDLDIRECT    Wt Readings from Last 3 Encounters:  08/24/17 184  lb 6.4 oz (83.6 kg)  08/16/17 181 lb 3.2 oz (82.2 kg)  07/06/17 178 lb 9.6 oz (81 kg)      Other studies Reviewed: Additional studies/ records that were reviewed today include:  Alive Cor Tracings.   Review of the above records demonstrates:    ASSESSMENT AND PLAN:  Chest pain He has had an extensive cardiac work up.  No further testing is indicated.  He can go back to his PCP to discuss non anginal chest pain work up.   Aortic regurgitation This was trivial.  No further testing is indicated.   Hypertension His BP is controlled.  He can take meds as listed.   Palpitations He can continue to transmit Alive cor tracings and I would be happy to review these.     Current medicines are reviewed at length with the patient today.  The patient does not have concerns regarding  medicines.  The following changes have been made:  None  Labs/ tests ordered today include: None No orders of the defined types were placed in this encounter.    Disposition:   FU with me in as needed.   Signed, Rollene RotundaJames Lajoya Dombek, MD  08/24/2017 7:57 AM    [facility]

## 2017-08-24 ENCOUNTER — Encounter: Payer: Self-pay | Admitting: Cardiology

## 2017-08-24 ENCOUNTER — Ambulatory Visit (INDEPENDENT_AMBULATORY_CARE_PROVIDER_SITE_OTHER): Payer: 59 | Admitting: Cardiology

## 2017-08-24 VITALS — BP 124/78 | HR 79 | Ht 72.0 in | Wt 184.4 lb

## 2017-08-24 DIAGNOSIS — R079 Chest pain, unspecified: Secondary | ICD-10-CM | POA: Diagnosis not present

## 2017-08-24 NOTE — Patient Instructions (Signed)
Medication Instructions:  Continue current medications  If you need a refill on your cardiac medications before your next appointment, please call your pharmacy.  Labwork: None Ordered   Testing/Procedures: None Ordered  IT#: [id number]  Follow-Up: Your physician wants you to follow-up in: As Needed.      [facility] you [facility] choosing [facility]!!

## 2017-09-04 ENCOUNTER — Ambulatory Visit: Payer: 59 | Admitting: Emergency Medicine

## 2017-09-04 ENCOUNTER — Telehealth: Payer: Self-pay

## 2017-09-04 NOTE — Telephone Encounter (Signed)
-----   Message from Unk LightningJennifer Lynne Lemmon, [place]PA sent at 09/03/2017  4:11 PM EDT ----- Please let the patient know. He can call around and see if he can get in to Rheumatology. Thanks-JLL  ----- Message ----- From: Evonnie PatMorayati, Braleigh Massoud Gail, RMA Sent: 09/03/2017   2:42 PM To: Unk LightningJennifer Lynne Lemmon, PA  Adrienne at Dr. Fatima Sangereveshwar's office called to let us know that the patient has been declined for referral.  She stated the doctor did not state why.  Thanks Malachi BondsGloria

## 2017-09-04 NOTE — Telephone Encounter (Signed)
Contacted patient by phone regarding referral being declined. Per Victorino DikeJennifer have patient call around.  Patient verbalized understanding.

## 2017-10-04 ENCOUNTER — Ambulatory Visit (INDEPENDENT_AMBULATORY_CARE_PROVIDER_SITE_OTHER): Payer: 59 | Admitting: Emergency Medicine

## 2017-10-04 ENCOUNTER — Encounter: Payer: Self-pay | Admitting: Emergency Medicine

## 2017-10-04 VITALS — BP 126/86 | HR 76 | Ht 72.0 in | Wt 185.0 lb

## 2017-10-04 DIAGNOSIS — R079 Chest pain, unspecified: Secondary | ICD-10-CM

## 2017-10-04 NOTE — Patient Instructions (Signed)
We will arrange for a cardiopulmonary exercise test to try to reproduce your symptoms, assess their cause.  Follow with Dr Delton CoombesByrum next available after your testing to review the results together.

## 2017-10-04 NOTE — Progress Notes (Signed)
Subjective:    Patient ID: Vincent Ibarra, male    DOB: Sep 17, 1990, 27 y.o.   MRN: [id number]  HPI 27 yo man, minimal tobacco exposure, hx anemia, anxiety, HTN, hemorrhoids. He was well until Nov 2018, had L sided tightness and pain after smoking. Maybe some associated palpitation.  Persisted until he took his amlodipine next day. He noted that it would return when he exercised, playing basketball.  Can be exacerbated by singing loud speaking. He has seen cardiology with reassuring eval as below. Also working with GI. He did a two week trial of omeprazole without any change. He notes that he can take his Fe pill and make it better - unclear why. At some point, dx of fibromyalgia has been entertained - not currently on meds for this.   Autoimmune labs > normal ACE, ANA, ESR, CRP Urine metanephrines > normal.   ROV 10/04/17 --this is a follow-up visit for patient with history of anxiety, hypertension, anemia who I have seen for left-sided chest tightness and pain after smoking.  Its persisted and occasionally been brought on by exercise, singing, etc.  He had a reassuring cardiac evaluation.  I arrange for a ventilation/perfusion scan which showed low probability for pulmonary embolism.  He underwent pulmonary function testing today that I have reviewed.  This shows normal airflows but with a hyperinflated residual volume (178% predicted), normal diffusion capacity. He notices it the pain after the gym. Still happens to him, last real flare was about a week ago.    Review of Systems  HENT: Positive for dental problem, ear pain, postnasal drip, sinus pressure and sore throat.   Eyes: Positive for itching.  Respiratory: Positive for shortness of breath.   Cardiovascular: Positive for palpitations.  Genitourinary: Positive for dysuria.  Allergic/Immunologic: Negative.   Psychiatric/Behavioral: The patient is nervous/anxious.    Coronary CT chest 04/24/17 >> no evidence of CAD, no significant  infiltrates noted on parenchymal windows (apices not visualized) Echocardiogram 02/28/17 >> normal LV function and diastolic function, trivial aortic insufficiency normal right-sided function and normal PAP  CXR 01/30/17 >> normal.    Past Medical History:  Diagnosis Date  . Anemia    "since childhood" denies Sickle cell history  . Anxiety   . Blood in stool   . Hypertension   . Left ventricular hypertrophy   . Mononucleosis   . Snoring 2019   pt reported apnea sx, seen at pulm and was not felt to be sleep apnea.      Family History  Problem Relation Age of Onset  . Hypertension Mother   . Lupus Mother   . Hypertension Father   . Diabetes Father   . Hypertension Sister   . Mental illness Brother      Social History   Socioeconomic History  . Marital status: Single    Spouse name: Not on file  . Number of children: 0  . Years of education: Not on file  . Highest education level: Not on file  Occupational History  . Occupation: Glass blower/designer  . Financial resource strain: Not on file  . Food insecurity:    Worry: Not on file    Inability: Not on file  . Transportation needs:    Medical: Not on file    Non-medical: Not on file  Tobacco Use  . Smoking status: Former Smoker    Last attempt to quit: 05/29/2016    Years since quitting: 1.3  . Smokeless tobacco: Never Used  Substance and Sexual Activity  . Alcohol use: No    Frequency: Never  . Drug use: No    Types: Marijuana    Comment: college years  . Sexual activity: Yes    Partners: Female  Lifestyle  . Physical activity:    Days per week: Not on file    Minutes per session: Not on file  . Stress: Not on file  Relationships  . Social connections:    Talks on phone: Not on file    Gets together: Not on file    Attends religious service: Not on file    Active member of club or organization: Not on file    Attends meetings of clubs or organizations: Not on file    Relationship status: Not on  file  . Intimate partner violence:    Fear of current or ex partner: Not on file    Emotionally abused: Not on file    Physically abused: Not on file    Forced sexual activity: Not on file  Other Topics Concern  . 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    College educated .   Routine exercise.   Takes a daily vitamin.   Uses herbal remedies.   Smoke alarm in the home. Her seatbelt.   Feels safe in his relationships.  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   No Known Allergies   Outpatient Medications Prior to Visit  Medication Sig Dispense Refill  . amLODipine (NORVASC) 10 MG tablet Take 1 tablet (10 mg total) by mouth daily. 90 tablet 2  . carvedilol (COREG) 3.125 MG tablet Take 1 tablet (3.125 mg total) by mouth 2 (two) times daily. 180 tablet 1  . DULoxetine (CYMBALTA) 30 MG capsule Take 1 capsule (30 mg total) by mouth daily. 90 capsule 0   No facility-administered medications prior to visit.         Objective:   Physical Exam Vitals:   10/04/17 1623  BP: 126/86  Pulse: 76  SpO2: 95%  Weight: 185 lb (83.9 kg)  Height: 6' (1.829 m)   Gen: Pleasant, thin, in no distress,  normal affect  ENT: No lesions,  mouth clear,  oropharynx clear, no postnasal drip  Neck: No JVD, no stridor  Lungs: No use of accessory muscles, clear without rales or rhonchi  Cardiovascular: RRR, heart sounds normal, no murmur or gallops, no peripheral edema  Musculoskeletal: No deformities, no cyanosis or clubbing, no tenderness palp costal margin or anterior L chest  Neuro: alert, non focal  Skin: Warm, no lesions or rashes      Assessment & Plan:  Chest pain of uncertain etiology Etiology is unclear.  It always is mid to left chest, seems to happen more often after exercise but can sometimes keep him up at night when it flares.  He had a reassuring cardiac evaluation, did not respond to empiric GERD  therapy.  His pulmonary function testing is normal with the exception of an elevated residual volume.  His chest x-ray does not show any infiltrates or abnormalities but he does look somewhat hyperinflated.  A VQ scan was low probability for PE.  At this point I think we should try to reproduce his symptoms with a cardiopulmonary exercise test see whether there is any ventilatory or cardiac limitation when the pain occurs.  He does state that he still gets palpitations when it is happening.  If we can come up with an etiology or reproduce the symptoms then I think we will just need to undertake reassurance.  Baltazar Apo, MD, PhD 10/04/2017, 4:55 PM [facility] [phone] or if no answer [phone]

## 2017-10-04 NOTE — Assessment & Plan Note (Signed)
Etiology is unclear.  It always is mid to left chest, seems to happen more often after exercise but can sometimes keep him up at night when it flares.  He had a reassuring cardiac evaluation, did not respond to empiric GERD therapy.  His pulmonary function testing is normal with the exception of an elevated residual volume.  His chest x-ray does not show any infiltrates or abnormalities but he does look somewhat hyperinflated.  A VQ scan was low probability for PE.  At this point I think we should try to reproduce his symptoms with a cardiopulmonary exercise test see whether there is any ventilatory or cardiac limitation when the pain occurs.  He does state that he still gets palpitations when it is happening.  If we can come up with an etiology or reproduce the symptoms then I think we will just need to undertake reassurance.

## 2017-10-09 ENCOUNTER — Other Ambulatory Visit (HOSPITAL_COMMUNITY): Payer: Self-pay | Admitting: *Deleted

## 2017-10-09 ENCOUNTER — Ambulatory Visit (HOSPITAL_COMMUNITY): Payer: 59 | Attending: Emergency Medicine

## 2017-10-09 DIAGNOSIS — R079 Chest pain, unspecified: Secondary | ICD-10-CM

## 2017-10-09 DIAGNOSIS — R0600 Dyspnea, unspecified: Secondary | ICD-10-CM | POA: Diagnosis not present

## 2017-10-25 ENCOUNTER — Encounter: Payer: Self-pay | Admitting: Emergency Medicine

## 2017-10-25 ENCOUNTER — Ambulatory Visit (INDEPENDENT_AMBULATORY_CARE_PROVIDER_SITE_OTHER): Payer: BLUE CROSS/BLUE SHIELD | Admitting: Emergency Medicine

## 2017-10-25 DIAGNOSIS — R0600 Dyspnea, unspecified: Secondary | ICD-10-CM | POA: Insufficient documentation

## 2017-10-25 DIAGNOSIS — R0609 Other forms of dyspnea: Secondary | ICD-10-CM | POA: Diagnosis not present

## 2017-10-25 MED ORDER — ALBUTEROL SULFATE HFA 108 (90 BASE) MCG/ACT IN AERS: 2.0000 | INHALATION_SPRAY | RESPIRATORY_TRACT | 5 refills | Status: DC | PRN

## 2017-10-25 NOTE — Assessment & Plan Note (Signed)
Dyspnea on exertion.  Intermittent chest pain.  Not clear to me that the 2 are related to each other.  We have question a component of GERD.  He also has some intermittent tachycardia.  His cardiopulmonary exercise test showed a normal cardiac response to exercise, normal ventilatory response to exercise.  His pre-and post spirometry that showed a 10% decline in FEV1 with exercise.  This is not typically enough to be diagnostic of exercise-induced asthma but it is at least suggestive.  Of asked him to do a trial of albuterol, pretreating exercise and using it as needed if he develops symptoms to see if he benefits.  He will report back to me to let me know if there is been a clinical impact

## 2017-10-25 NOTE — Patient Instructions (Signed)
Your cardiopulmonary exercise testing is suggestive of possible exercise-related asthma.  We will start albuterol, use 2 puffs either 15 minutes prior to exercise or if needed for shortness of breath, wheezing, chest tightness. Keep track of how the albuterol helps you so we can decide whether to continue it.  Follow with Dr Delton CoombesByrum in 2 months or sooner if you have any problems.

## 2017-10-25 NOTE — Progress Notes (Signed)
Patient seen in the office today and instructed on use of Albuterol HFA.  Patient expressed understanding and demonstrated technique.  

## 2017-10-25 NOTE — Progress Notes (Signed)
Subjective:    Patient ID: Vincent Ibarra, male    DOB: February 11, 1991, 27 y.o.   MRN: [id number]  HPI 27 yo man, minimal tobacco exposure, hx anemia, anxiety, HTN, hemorrhoids. He was well until Nov 2018, had L sided tightness and pain after smoking. Maybe some associated palpitation.  Persisted until he took his amlodipine next day. He noted that it would return when he exercised, playing basketball.  Can be exacerbated by singing loud speaking. He has seen cardiology with reassuring eval as below. Also working with GI. He did a two week trial of omeprazole without any change. He notes that he can take his Fe pill and make it better - unclear why. At some point, dx of fibromyalgia has been entertained - not currently on meds for this.   Autoimmune labs > normal ACE, ANA, ESR, CRP Urine metanephrines > normal.   ROV 10/04/17 --this is a follow-up visit for patient with history of anxiety, hypertension, anemia who I have seen for left-sided chest tightness and pain after smoking.  Its persisted and occasionally been brought on by exercise, singing, etc.  He had a reassuring cardiac evaluation.  I arrange for a ventilation/perfusion scan which showed low probability for pulmonary embolism.  He underwent pulmonary function testing today that I have reviewed.  This shows normal airflows but with a hyperinflated residual volume (178% predicted), normal diffusion capacity. He notices it the pain after the gym. Still happens to him, last real flare was about a week ago.   ROV 10/25/17 --this is a follow-up visit for dyspnea and intermittent chest tightness.  He has had an overall reassuring evaluation then including cardiac evaluation, normal VQ scan, normal airflows on pulmonary function testing but with hyperinflated lung volumes.  In absence of a clear etiology we performed cardiopulm exercise test.  He had a normal functional capacity compared with sedentary norms.  There is a slight decrease in his  FEV1 (10%) post exercise.     Review of Systems  HENT: Positive for dental problem, ear pain, postnasal drip, sinus pressure and sore throat.   Eyes: Positive for itching.  Respiratory: Positive for shortness of breath.   Cardiovascular: Positive for palpitations.  Genitourinary: Positive for dysuria.  Allergic/Immunologic: Negative.   Psychiatric/Behavioral: The patient is nervous/anxious.    Coronary CT chest 04/24/17 >> no evidence of CAD, no significant infiltrates noted on parenchymal windows (apices not visualized) Echocardiogram 02/28/17 >> normal LV function and diastolic function, trivial aortic insufficiency normal right-sided function and normal PAP  CXR 01/30/17 >> normal.    Past Medical History:  Diagnosis Date  . Anemia    "since childhood" denies Sickle cell history  . Anxiety   . Blood in stool   . Hypertension   . Left ventricular hypertrophy   . Mononucleosis   . Snoring 2019   pt reported apnea sx, seen at pulm and was not felt to be sleep apnea.      Family History  Problem Relation Age of Onset  . Hypertension Mother   . Lupus Mother   . Hypertension Father   . Diabetes Father   . Hypertension Sister   . Mental illness Brother      Social History   Socioeconomic History  . Marital status: Single    Spouse name: Not on file  . Number of children: 0  . Years of education: Not on file  . Highest education level: Not on file  Occupational History  . Occupation: Chief Financial Officer  Social Needs  . Financial resource strain: Not on file  . Food insecurity:    Worry: Not on file    Inability: Not on file  . Transportation needs:    Medical: Not on file    Non-medical: Not on file  Tobacco Use  . Smoking status: Former Smoker    Types: Cigars    Last attempt to quit: 05/29/2016    Years since quitting: 1.4  . Smokeless tobacco: Never Used  Substance and Sexual Activity  . Alcohol use: No    Frequency: Never  . Drug use: No    Types: Marijuana     Comment: college years  . Sexual activity: Yes    Partners: Female  Lifestyle  . Physical activity:    Days per week: Not on file    Minutes per session: Not on file  . Stress: Not on file  Relationships  . Social connections:    Talks on phone: Not on file    Gets together: Not on file    Attends religious service: Not on file    Active member of club or organization: Not on file    Attends meetings of clubs or organizations: Not on file    Relationship status: Not on file  . Intimate partner violence:    Fear of current or ex partner: Not on file    Emotionally abused: Not on file    Physically abused: Not on file    Forced sexual activity: Not on file  Other Topics Concern  . 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    College educated .   Routine exercise.   Takes a daily vitamin.   Uses herbal remedies.   Smoke alarm in the home. Her seatbelt.   Feels safe in his relationships.  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   No Known Allergies   Outpatient Medications Prior to Visit  Medication Sig Dispense Refill  . amLODipine (NORVASC) 10 MG tablet Take 1 tablet (10 mg total) by mouth daily. 90 tablet 2  . carvedilol (COREG) 3.125 MG tablet Take 1 tablet (3.125 mg total) by mouth 2 (two) times daily. 180 tablet 1  . DULoxetine (CYMBALTA) 30 MG capsule Take 1 capsule (30 mg total) by mouth daily. 90 capsule 0   No facility-administered medications prior to visit.         Objective:   Physical Exam Vitals:   10/25/17 1622  BP: 116/74  Pulse: 74  SpO2: 98%  Weight: 185 lb (83.9 kg)  Height: '6\' 1"'  (1.854 m)   Gen: Pleasant, thin, in no distress,  normal affect  ENT: No lesions,  mouth clear,  oropharynx clear, no postnasal drip  Neck: No JVD, no stridor  Lungs: No use of accessory muscles, clear without rales or rhonchi  Cardiovascular: RRR, heart sounds normal, no murmur or  gallops, no peripheral edema  Musculoskeletal: No deformities, no cyanosis or clubbing, no tenderness palp costal margin or anterior L chest  Neuro: alert, non focal  Skin: Warm, no lesions or rashes      Assessment & Plan:  Dyspnea Dyspnea on exertion.  Intermittent chest pain.  Not clear to me that the 2 are related to each other.  We have question a component of GERD.  He also has some intermittent tachycardia.  His cardiopulmonary exercise test showed a normal cardiac response to exercise, normal ventilatory response  to exercise.  His pre-and post spirometry that showed a 10% decline in FEV1 with exercise.  This is not typically enough to be diagnostic of exercise-induced asthma but it is at least suggestive.  Of asked him to do a trial of albuterol, pretreating exercise and using it as needed if he develops symptoms to see if he benefits.  He will report back to me to let me know if there is been a clinical impact  Baltazar Apo, MD, PhD 10/25/2017, 5:21 PM  [facility] [phone] or if no answer [phone]

## 2018-01-31 ENCOUNTER — Telehealth: Payer: Self-pay | Admitting: Cardiology

## 2018-01-31 NOTE — Telephone Encounter (Signed)
OK with me.

## 2018-01-31 NOTE — Telephone Encounter (Signed)
New message    [facility] [place] [facility] Due to location

## 2018-03-02 ENCOUNTER — Encounter: Payer: Self-pay | Admitting: Physician Assistant

## 2018-03-02 NOTE — Progress Notes (Deleted)
Cardiology Office Note    Date:  03/02/2018  ID:  Page Vincent Ibarra, DOB 10/06/1990, MRN [id number] PCP:  Natalia LeatherwoodKuneff, Renee A, DO  Cardiologist:  Rollene RotundaJames Hochrein, MD   Chief Complaint: ***  History of Present Illness:  Vincent Ibarra is a 27 y.o. male with history of HTN, anemia, anxiety, HTN, mononucleosis, and prior negative cardiac workup who presents today [facility]. He was previously seen by Dr. Okey DupreEnd after preceding ED visits for elevated blood pressure and atypical chest pain. 2D echo was unremarkable except for trivial AI. His chart carries history of LVH, but this was not seen on echo in 02/2017. EF was 60-65% with normal diastolic function. 24-hour urine fractionated catecholamine/metanephrines were normal. Renal artery duplex 05/2017 was not felt to show any significant RAS. He has reportedly had a sleep study in the past that did not show any sleep apnea. His chest pain previously reportedly improved with iron supplementation. Coronary CTA 04/2017 was negative for coarctation, coronary disease or any abnormal findings. VQ scan 07/2017 was negative for PE. Renin aldo level was previously obtained but not resulted. Last labs otherwise 04/2017 showed normal CBC, normal BMET, normal thyroid, and UA without proteinuria. He then saw Dr. Antoine PocheHochrein in 08/2017 with numerous complaints but no objective cardiac findings. AliveCor was suggested for palpitations.   renin/aldo level   Atypical chest pain Essential HTN Mild AI Palpitations    Past Medical History:  Diagnosis Date  . Anemia    "since childhood" denies Sickle cell history  . Anxiety   . Blood in stool   . Hypertension   . Mild aortic insufficiency   . Mononucleosis   . Snoring 2019   pt reported apnea sx, seen at pulm and was not felt to be sleep apnea.     Past Surgical History:  Procedure Laterality Date  . NO PAST SURGERIES      Current Medications: No outpatient  medications have been marked as taking for the 03/04/18 encounter (Appointment) with Laurann Montanaunn, Corban Kistler N, PA-C.   ***   Allergies:   Patient has no known allergies.   Social History   Socioeconomic History  . Marital status: Single    Spouse name: Not on file  . Number of children: 0  . Years of education: Not on file  . Highest education level: Not on file  Occupational History  . Occupation: Paediatric nursengineer  Social Needs  . Financial resource strain: Not on file  . Food insecurity:    Worry: Not on file    Inability: Not on file  . Transportation needs:    Medical: Not on file    Non-medical: Not on file  Tobacco Use  . Smoking status: Former Smoker    Types: Cigars    Last attempt to quit: 05/29/2016    Years since quitting: 1.7  . Smokeless tobacco: Never Used  Substance and Sexual Activity  . Alcohol use: No    Frequency: Never  . Drug use: No    Types: Marijuana    Comment: college years  . Sexual activity: Yes    Partners: Female  Lifestyle  . Physical activity:    Days per week: Not on file    Minutes per session: Not on file  . Stress: Not on file  Relationships  . Social connections:    Talks on phone: Not on file    Gets together: Not on file    Attends religious service: Not  on file    Active member of club or organization: Not on file    Attends meetings of clubs or organizations: Not on file    Relationship status: Not on file  Other Topics Concern  . 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    College educated .   Routine exercise.   Takes a daily vitamin.   Uses herbal remedies.   Smoke alarm in the home. Her seatbelt.   Feels safe in his relationships.     Family History:  The patient's ***family history includes Diabetes in his father; Hypertension in his father, mother, and sister; Lupus in his mother; Mental illness in his brother.  ROS:   Please see the history of present illness. Otherwise, review of systems is  positive for ***.  All other systems are reviewed and otherwise negative.    PHYSICAL EXAM:   VS:  There were no vitals taken for this visit.  BMI: There is no height or weight on file to calculate BMI. GEN: Well nourished, well developed, in no acute distress HEENT: normocephalic, atraumatic Neck: no JVD, carotid bruits, or masses Cardiac: ***RRR; no murmurs, rubs, or gallops, no edema  Respiratory:  clear to auscultation bilaterally, normal work of breathing GI: soft, nontender, nondistended, + BS MS: no deformity or atrophy Skin: warm and dry, no rash Neuro:  Alert and Oriented x 3, Strength and sensation are intact, follows commands Psych: euthymic mood, full affect  Wt Readings from Last 3 Encounters:  10/25/17 185 lb (83.9 kg)  10/04/17 185 lb (83.9 kg)  08/24/17 184 lb 6.4 oz (83.6 kg)      Studies/Labs Reviewed:   EKG:  EKG was ordered today and personally reviewed by me and demonstrates *** EKG was not ordered today.***  Recent Labs: 04/23/2017: TSH 1.26 04/28/2017: BUN 9; Creatinine, Ser 0.95; Hemoglobin 13.7; Platelets 252; Potassium 3.7; Sodium 138   Lipid Panel No results found for: CHOL, TRIG, HDL, CHOLHDL, VLDL, LDLCALC, LDLDIRECT  Additional studies/ records that were reviewed today include: Summarized above.***    ASSESSMENT & PLAN:   1. ***  Disposition: F/u with ***   Medication Adjustments/Labs and Tests Ordered: Current medicines are reviewed at length with the patient today.  Concerns regarding medicines are outlined above. Medication changes, Labs and Tests ordered today are summarized above and listed in the Patient Instructions accessible in Encounters.   Signed, Laurann Montanaayna N Nochum Fenter, PA-C  03/02/2018 9:37 AM    [facility] [address], [place], [place]  [address] Phone: [phone]; Fax: [phone]

## 2018-03-04 ENCOUNTER — Ambulatory Visit: Payer: BLUE CROSS/BLUE SHIELD | Admitting: Physician Assistant

## 2018-03-08 ENCOUNTER — Encounter: Payer: Self-pay | Admitting: Physician Assistant

## 2018-03-29 ENCOUNTER — Encounter: Payer: BLUE CROSS/BLUE SHIELD | Admitting: Family Medicine

## 2018-04-10 ENCOUNTER — Ambulatory Visit (INDEPENDENT_AMBULATORY_CARE_PROVIDER_SITE_OTHER): Payer: BLUE CROSS/BLUE SHIELD | Admitting: Family Medicine

## 2018-04-10 ENCOUNTER — Encounter: Payer: Self-pay | Admitting: Family Medicine

## 2018-04-10 VITALS — BP 118/67 | HR 57 | Temp 98.0°F | Resp 16 | Ht 73.0 in | Wt 196.1 lb

## 2018-04-10 DIAGNOSIS — K0530 Chronic periodontitis, unspecified: Secondary | ICD-10-CM | POA: Diagnosis not present

## 2018-04-10 DIAGNOSIS — R200 Anesthesia of skin: Secondary | ICD-10-CM | POA: Diagnosis not present

## 2018-04-10 DIAGNOSIS — R202 Paresthesia of skin: Secondary | ICD-10-CM | POA: Diagnosis not present

## 2018-04-10 LAB — HEMOGLOBIN A1C: HEMOGLOBIN A1C: 5.8 % (ref 4.6–6.5)

## 2018-04-10 LAB — COMPREHENSIVE METABOLIC PANEL
ALT: 21 U/L (ref 0–53)
AST: 18 U/L (ref 0–37)
Albumin: 4.8 g/dL (ref 3.5–5.2)
Alkaline Phosphatase: 62 U/L (ref 39–117)
BUN: 13 mg/dL (ref 6–23)
CALCIUM: 9.8 mg/dL (ref 8.4–10.5)
CO2: 29 mEq/L (ref 19–32)
CREATININE: 0.98 mg/dL (ref 0.40–1.50)
Chloride: 102 mEq/L (ref 96–112)
GFR: 112.33 mL/min (ref >60.00)
GLUCOSE: 89 mg/dL (ref 70–99)
POTASSIUM: 4.1 meq/L (ref 3.5–5.1)
Sodium: 137 mEq/L (ref 135–145)
TOTAL PROTEIN: 7.7 g/dL (ref 6.0–8.3)
Total Bilirubin: 0.5 mg/dL (ref 0.2–1.2)

## 2018-04-10 NOTE — Progress Notes (Signed)
.     Patient ID: Vincent Ibarra, male  DOB: 1990/12/05, 28 y.o.   MRN: [id number] Patient Care Team    Relationship Specialty Notifications Start End  Ma Hillock, DO PCP - General Family Medicine  04/23/17   Minus Breeding, MD PCP - Cardiology Cardiology  17/79/39   Jodi Marble, MD Consulting Physician Otolaryngology  04/23/17   Minus Breeding, MD Consulting Physician Cardiology  07/06/17   Collene Gobble, MD Consulting Physician Pulmonary Disease  07/06/17   Ladene Artist, MD Consulting Physician Gastroenterology  07/06/17     Chief Complaint  Patient presents with  . Foot Pain    R foot pain x29yr   Subjective:  Vincent Abshieris a 28y.o. male present right foot pain.  Patient presents today and would like to discuss his foot pain.  He states that whenever he eats something sweet, whether sugary or fruit, he has a numbness/tingling sensation that occurs anterior foot, ankle and sometimes below his knee.  He states this only occurs on his right lower extremity.  He does workout quite frequently.  He does not recall any injury.  Prior note 05/10/2017:  Pt presents for an OV to review workup surrounding multiple complaints voiced on establishment visit. Today patient complains of mild right foot numbness along his medial arch, 4 foot large toe. He is concerned about fibromyalgia, peripheral artery disease and diabetes. He also endorses continued leg cramps in his bilateral thighs. Patient was seen in the emergency room on 04/28/2017 for this condition with a normal CBC and BMP. His original complaint on presentation on establishment was dyspnea which has been worked up through cardiology. He does have hypertension which is being treated by cardiology. He feels his dyspnea improved after starting iron. He was encouraged to have a sleep study and hasn't scheduled a sleep study for the end of February  Periodontitis: Patient also endorses periodontitis.  He states his  dentist reports 1 of the medications he is on for his blood pressure is known to cause periodontitis.  Patient is on Coreg which does have the side effect profile.  He is only taking very low-dose Coreg 3.125 and he is only taking it daily instead of twice daily.  His heart rate is routinely in the 50s.  He was placed on this medicine by cardiology for what sounds like palpitations and racing heart.  [facility] 04/10/2018 04/23/2017  Decreased Interest 2 0  Down, Depressed, Hopeless 1 0  PHQ - 2 Score 3 0  Altered sleeping 2 -  Tired, decreased energy 2 -  Change in appetite 0 -  Feeling bad or failure about yourself  0 -  Trouble concentrating 0 -  Moving slowly or fidgety/restless 0 -  Suicidal thoughts 0 -  PHQ-9 Score 7 -  Difficult doing work/chores Somewhat difficult -   GAD 7 : Generalized Anxiety Score 04/10/2018  Nervous, Anxious, on Edge 2  Control/stop worrying 0  Worry too much - different things 1  Trouble relaxing 0  Restless 0  Easily annoyed or irritable 2  Afraid - awful might happen 0  Total GAD 7 Score 5  Anxiety Difficulty Somewhat difficult      No flowsheet data found.  There is no immunization history on file for this patient.   Past Medical History:  Diagnosis Date  . Anemia    "since childhood" denies Sickle cell history  . Anxiety   . Blood in  stool   . Hypertension   . Mild aortic insufficiency   . Mononucleosis   . Snoring 2019   pt reported apnea sx, seen at pulm and was not felt to be sleep apnea.    No Known Allergies Past Surgical History:  Procedure Laterality Date  . NO PAST SURGERIES     Family History  Problem Relation Age of Onset  . Hypertension Mother   . Lupus Mother   . Hypertension Father   . Diabetes Father   . Hypertension Sister   . Mental illness Brother    Social History   Socioeconomic History  . Marital status: Single    Spouse name: Not on file  . Number of children: 0  . Years of  education: Not on file  . Highest education level: Not on file  Occupational History  . Occupation: Glass blower/designer  . Financial resource strain: Not on file  . Food insecurity:    Worry: Not on file    Inability: Not on file  . Transportation needs:    Medical: Not on file    Non-medical: Not on file  Tobacco Use  . Smoking status: Former Smoker    Types: Cigars    Last attempt to quit: 05/29/2016    Years since quitting: 1.8  . Smokeless tobacco: Never Used  Substance and Sexual Activity  . Alcohol use: No    Frequency: Never  . Drug use: No    Types: Marijuana    Comment: college years  . Sexual activity: Yes    Partners: Female  Lifestyle  . Physical activity:    Days per week: Not on file    Minutes per session: Not on file  . Stress: Not on file  Relationships  . Social connections:    Talks on phone: Not on file    Gets together: Not on file    Attends religious service: Not on file    Active member of club or organization: Not on file    Attends meetings of clubs or organizations: Not on file    Relationship status: Not on file  . Intimate partner violence:    Fear of current or ex partner: Not on file    Emotionally abused: Not on file    Physically abused: Not on file    Forced sexual activity: Not on file  Other Topics Concern  . 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    College educated .   Routine exercise.   Takes a daily vitamin.   Uses herbal remedies.   Smoke alarm in the home. Her seatbelt.   Feels safe in his relationships.   Allergies as of 04/10/2018   No Known Allergies     Medication List       Accurate as of April 10, 2018 11:59 PM. Always use your most recent med list.        albuterol 108 (90 Base) MCG/ACT inhaler Commonly known as:  PROVENTIL HFA;VENTOLIN HFA Inhale 2 puffs into the lungs every 4 (four) hours as needed for wheezing or shortness of breath.   amLODipine 10 MG  tablet Commonly known as:  NORVASC Take 1 tablet (10 mg total) by mouth daily.   carvedilol 3.125 MG tablet Commonly known as:  COREG Take 1 tablet (3.125 mg total) by mouth 2 (two) times daily.      All past medical history, surgical history, allergies, family history, immunizations  andmedications were updated in the EMR today and reviewed under the history and medication portions of their EMR.     Recent Results (from the past 2160 hour(s))  HgB A1c     Status: None   Collection Time: 04/10/18  8:41 AM  Result Value Ref Range   Hgb A1c MFr Bld 5.8 4.6 - 6.5 %    Comment: Glycemic Control Guidelines for People with Diabetes:Non Diabetic:  <6%Goal of Therapy: <7%Additional Action Suggested:  >8%   Comp Met (CMET)     Status: None   Collection Time: 04/10/18  8:41 AM  Result Value Ref Range   Sodium 137 135 - 145 mEq/L   Potassium 4.1 3.5 - 5.1 mEq/L   Chloride 102 96 - 112 mEq/L   CO2 29 19 - 32 mEq/L   Glucose, Bld 89 70 - 99 mg/dL   BUN 13 6 - 23 mg/dL   Creatinine, Ser 0.98 0.40 - 1.50 mg/dL   Total Bilirubin 0.5 0.2 - 1.2 mg/dL   Alkaline Phosphatase 62 39 - 117 U/L   AST 18 0 - 37 U/L   ALT 21 0 - 53 U/L   Total Protein 7.7 6.0 - 8.3 g/dL   Albumin 4.8 3.5 - 5.2 g/dL   Calcium 9.8 8.4 - 10.5 mg/dL   GFR 112.33 >60.00 mL/min     ROS: 14 pt review of systems performed and negative (unless mentioned in an HPI)  Objective: BP 118/67 (BP Location: Left Arm, Patient Position: Sitting, Cuff Size: Normal)   Pulse (!) 57   Temp 98 F (36.7 C) (Oral)   Resp 16   Ht '6\' 1"'$  (1.854 m)   Wt 196 lb 2 oz (89 kg)   SpO2 99%   BMI 25.88 kg/m  Gen: Afebrile. No acute distress. Nontoxic in appearance, well-developed, well-nourished, physically fit African-American male. HENT: AT. [place].MMM, no oral lesions, adequate dentition.  Periodontitis present.   Eyes:Pupils Equal Round Reactive to light, Extraocular movements intact,  Conjunctiva without redness, discharge or icterus. CV:  RRR  Chest: CTAB, no wheeze, rhonchi or crackles.  Neuro/Msk: Normal gait. PERLA. EOMi. Alert. Oriented x3.     -Lateral lower extremities without erythema, no soft tissue swelling.  Posterior tibialis pulses equal bilateral lower extremities.  Normal touch sensation bilateral lower extremities.  Neurovascularly intact distally.  No exam data present  Assessment/plan: Vincent Ibarra is a 28 y.o. male present for CPE Numbness and tingling of right leg Uncertain cause of patient's symptoms.  Reassured him this does not sound like something that would be related to diabetes.  Will check A1c for reassurance.  CMP to rule out other electrolytes.  We discussed this is more likely something muscle skeletal in nature and possibly could be secondary to something he is doing with his workouts.  Discussed options such as referral to sports med if labs are normal.  He is agreeable to this. - HgB A1c - Comp Met (CMET) - Ambulatory referral to Sports Medicine  Periodontitis Patient does have periodontitis on exam today.  Coreg is known to cause this side effect.  Discussed with the patient there are other options for beta blockers, however he is on lowest dose Coreg 3.125 and he is only taking daily currently with a heart rate in the 50s.  Encouraged him to discuss management with his cardiologist.  Return if symptoms worsen or fail to improve.  > 25 minutes spent with patient, >50% of time spent face to face  Note is dictated utilizing voice recognition software. Although note has been proof read prior to signing, occasional typographical errors still can be missed. If any questions arise, please do not hesitate to call for verification.  Electronically signed by: Howard Pouch, DO [place]

## 2018-04-10 NOTE — Patient Instructions (Signed)
Talk your cardiologist about your coreg- it can cause periodontitis. If you need to continue that type of medicine Metoprolol may be another option. Although make sure your cardiologist knows you have only been taking the coreg once daily and your heart rate today is < 60 with last dose yesterday afternoon.   We will call you with lab results.   We will send you to sports med for evaluation on your leg.    Please help [place] help you:  We are honored you have chosen [facility] for your Primary Care home. Below you will find basic instructions that you may need to access in the future. Please help [place] help you by reading the instructions, which cover many of the frequent questions we experience.   Prescription refills and request:  -In order to allow more efficient response time, please call your pharmacy for all refills. They will forward the request electronically to [place]. This allows for the quickest possible response. Request left on a nurse line can take longer to refill, since these are checked as time allows between office patients and other phone calls.  - refill request can take up to 3-5 working days to complete.  - If request is sent electronically and request is appropiate, it is usually completed in 1-2 business days.  - all patients will need to be seen routinely for all chronic medical conditions requiring prescription medications (see follow-up below). If you are overdue for follow up on your condition, you will be asked to make an appointment and we will call in enough medication to cover you until your appointment (up to 30 days).  - all controlled substances will require a face to face visit to request/refill.  - if you desire your prescriptions to go through a new pharmacy, and have an active script at original pharmacy, you will need to call your pharmacy and have scripts transferred to new pharmacy. This is completed between the pharmacy locations and not by your provider.     Results: If any images or labs were ordered, it can take up to 1 week to get results depending on the test ordered and the lab/facility running and resulting the test. - Normal or stable results, which do not need further discussion, may be released to your mychart immediately with attached note to you. A call may not be generated for normal results. Please make certain to sign up for mychart. If you have questions on how to activate your mychart you can call the front office.  - If your results need further discussion, our office will attempt to contact you via phone, and if unable to reach you after 2 attempts, we will release your abnormal result to your mychart with instructions.  - All results will be automatically released in mychart after 1 week.  - Your provider will provide you with explanation and instruction on all relevant material in your results. Please keep in mind, results and labs may appear confusing or abnormal to the untrained eye, but it does not mean they are actually abnormal for you personally. If you have any questions about your results that are not covered, or you desire more detailed explanation than what was provided, you should make an appointment with your provider to do so.   Our office handles many outgoing and incoming calls daily. If we have not contacted you within 1 week about your results, please check your mychart to see if there is a message first and if not,  then contact our office.  In helping with this matter, you help decrease call volume, and therefore allow [place] to be able to respond to patients needs more efficiently.   Acute office visits (sick visit):  An acute visit is intended for a new problem and are scheduled in shorter time slots to allow schedule openings for patients with new problems. This is the appropriate visit to discuss a new problem. Problems will not be addressed by phone call or Echart message. Appointment is needed if requesting  treatment. In order to provide you with excellent quality medical care with proper time for you to explain your problem, have an exam and receive treatment with instructions, these appointments should be limited to one new problem per visit. If you experience a new problem, in which you desire to be addressed, please make an acute office visit, we save openings on the schedule to accommodate you. Please do not save your new problem for any other type of visit, let us take care of it properly and quickly for you.   Follow up visits:  Depending on your condition(s) your provider will need to see you routinely in order to provide you with quality care and prescribe medication(s). Most chronic conditions (Example: hypertension, Diabetes, depression/anxiety... etc), require visits a couple times a year. Your provider will instruct you on proper follow up for your personal medical conditions and history. Please make certain to make follow up appointments for your condition as instructed. Failing to do so could result in lapse in your medication treatment/refills. If you request a refill, and are overdue to be seen on a condition, we will always provide you with a 30 day script (once) to allow you time to schedule.    Medicare wellness (well visit): - we have a wonderful Nurse Maudie Mercury), that will meet with you and provide you will yearly medicare wellness visits. These visits should occur yearly (can not be scheduled less than 1 calendar year apart) and cover preventive health, immunizations, advance directives and screenings you are entitled to yearly through your medicare benefits. Do not miss out on your entitled benefits, this is when medicare will pay for these benefits to be ordered for you.  These are strongly encouraged by your provider and is the appropriate type of visit to make certain you are up to date with all preventive health benefits. If you have not had your medicare wellness exam in the last 12  months, please make certain to schedule one by calling the office and schedule your medicare wellness with Maudie Mercury as soon as possible.   Yearly physical (well visit):  - Adults are recommended to be seen yearly for physicals. Check with your insurance and date of your last physical, most insurances require one calendar year between physicals. Physicals include all preventive health topics, screenings, medical exam and labs that are appropriate for gender/age and history. You may have fasting labs needed at this visit. This is a well visit (not a sick visit), new problems should not be covered during this visit (see acute visit).  - Pediatric patients are seen more frequently when they are younger. Your provider will advise you on well child visit timing that is appropriate for your their age. - This is not a medicare wellness visit. Medicare wellness exams do not have an exam portion to the visit. Some medicare companies allow for a physical, some do not allow a yearly physical. If your medicare allows a yearly physical you can schedule the medicare  wellness with our nurse Maudie Mercury and have your physical with your provider after, on the same day. Please check with insurance for your full benefits.   Late Policy/No Shows:  - all new patients should arrive 15-30 minutes earlier than appointment to allow [place] time  to  obtain all personal demographics,  insurance information and for you to complete office paperwork. - All established patients should arrive 10-15 minutes earlier than appointment time to update all information and be checked in .  - In our best efforts to run on time, if you are late for your appointment you will be asked to either reschedule or if able, we will work you back into the schedule. There will be a wait time to work you back in the schedule,  depending on availability.  - If you are unable to make it to your appointment as scheduled, please call 24 hours ahead of time to allow [place] to fill the  time slot with someone else who needs to be seen. If you do not cancel your appointment ahead of time, you may be charged a no show fee.

## 2018-04-11 ENCOUNTER — Encounter: Payer: Self-pay | Admitting: Family Medicine

## 2018-04-15 ENCOUNTER — Ambulatory Visit (INDEPENDENT_AMBULATORY_CARE_PROVIDER_SITE_OTHER): Payer: BLUE CROSS/BLUE SHIELD | Admitting: Nurse Practitioner

## 2018-04-15 ENCOUNTER — Encounter: Payer: Self-pay | Admitting: Nurse Practitioner

## 2018-04-15 ENCOUNTER — Ambulatory Visit: Payer: BLUE CROSS/BLUE SHIELD | Admitting: Physician Assistant

## 2018-04-15 VITALS — BP 130/82 | HR 46 | Ht 73.0 in | Wt 197.8 lb

## 2018-04-15 DIAGNOSIS — R079 Chest pain, unspecified: Secondary | ICD-10-CM

## 2018-04-15 NOTE — Progress Notes (Signed)
CARDIOLOGY OFFICE NOTE  Date:  04/15/2018    Vincent Ibarra Date of Birth: 12/05/1990 Medical Record #[id number]  PCP:  Vincent Ibarra, Vincent A, DO  Cardiologist:  Vincent Ibarra  Chief Complaint  Patient presents with  . Follow-up    Work in visit - seen for Vincent Ibarra    History of Present Illness: Vincent Ibarra is Ibarra 28 y.o. male who presents today for Ibarra work in follow up visit. Former patient of Vincent Ibarra's.   He has Ibarra history of HTN and trivial aortic regurgitation. He has had chronic chest pain and palpitations. He has had Ibarra normal coronary CTA in 2019.   Last seen by Vincent Ibarra in June of 2019. He was to see his PCP for his non cardiac chest pain.     Comes in today. Here alone. His history is quite hard to discern. It is listed in the appointment notes that he is wanting to switch providers. He would not tell my CMA why he was actually here. He tells me, he is here to "get my pills switched around". He has multiple complaints - gum disease, numbness in his right leg, worry about diabetes, fibromyalgia, PAD, etc. He denies being lightheaded or dizzy. He says he always has Ibarra low heart rate. He does not wish to come off his medicines.   Past Medical History:  Diagnosis Date  . Anemia    "since childhood" denies Sickle cell history  . Anxiety   . Blood in stool   . Hypertension   . Mild aortic insufficiency   . Mononucleosis   . Snoring 2019   pt reported apnea sx, seen at pulm and was not felt to be sleep apnea.     Past Surgical History:  Procedure Laterality Date  . NO PAST SURGERIES       Medications: Current Meds  Medication Sig  . amLODipine (NORVASC) 10 MG tablet Take 1 tablet (10 mg total) by mouth daily.  . carvedilol (COREG) 3.125 MG tablet Take 1 tablet (3.125 mg total) by mouth 2 (two) times daily.     Allergies: No Known Allergies  Social History: The patient  reports that he quit smoking about 22 months ago. His smoking use  included cigars. He has never used smokeless tobacco. He reports that he does not drink alcohol or use drugs.   Family History: The patient's family history includes Diabetes in his father; Hypertension in his father, mother, and sister; Lupus in his mother; Mental illness in his brother.   Review of Systems: Please see the history of present illness.   Otherwise, the review of systems is positive for none.   All other systems are reviewed and negative.   Physical Exam: VS:  BP 130/82 (BP Location: Left Arm, Patient Position: Sitting, Cuff Size: Normal)   Pulse (!) 46   Ht 6\' 1"  (1.854 m)   Wt 197 lb 12.8 oz (89.7 kg)   BMI 26.10 kg/m  .  BMI Body mass index is 26.1 kg/m.  Wt Readings from Last 3 Encounters:  04/15/18 197 lb 12.8 oz (89.7 kg)  04/10/18 196 lb 2 oz (89 kg)  10/25/17 185 lb (83.9 kg)    General: Alert and in no acute distress.   HEENT: Normal.  Neck: Supple, no JVD, carotid bruits, or masses noted.  Cardiac: Regular rate and rhythm. No murmurs, rubs, or gallops. No edema.  Respiratory:  Lungs are clear to auscultation bilaterally with normal work of  breathing.  GI: Soft and nontender.  MS: No deformity or atrophy. Gait and ROM intact.  Skin: Warm and dry. Color is normal.  Neuro:  Strength and sensation are intact and no gross focal deficits noted.  Psych: Alert, appropriate and with normal affect.   LABORATORY DATA:  EKG:  EKG is ordered today. This demonstrates sinus bradycardia. HR is 46.   Lab Results  Component Value Date   WBC 6.9 04/28/2017   HGB 13.7 04/28/2017   HCT 39.8 04/28/2017   PLT 252 04/28/2017   GLUCOSE 89 04/10/2018   ALT 21 04/10/2018   AST 18 04/10/2018   NA 137 04/10/2018   K 4.1 04/10/2018   CL 102 04/10/2018   CREATININE 0.98 04/10/2018   BUN 13 04/10/2018   CO2 29 04/10/2018   TSH 1.26 04/23/2017   HGBA1C 5.8 04/10/2018     BNP (last 3 results) No results for input(s): BNP in the last 8760 hours.  ProBNP (last 3  results) No results for input(s): PROBNP in the last 8760 hours.   Other Studies Reviewed Today: Coronary CT 04/2017 IMPRESSION: 1.  Calcium score 0  2.  Normal right dominant coronary arteries  3.  Normal aortic root 3.3 cm  Vincent Ibarra   Electronically Signed   By: Vincent HawsPeter  Ibarra M.D.   On: 04/24/2017 10:10   Echo Study Conclusions 02/2017 - Left ventricle: The cavity size was normal. Wall thickness was   normal. Systolic function was normal. The estimated ejection   fraction was in the range of 60% to 65%. Wall motion was normal;   there were no regional wall motion abnormalities. Left   ventricular diastolic function parameters were normal. - Aortic valve: Trileaflet. There was trivial regurgitation. - Inferior vena cava: The vessel was normal in size. The   respirophasic diameter changes were in the normal range (>= 50%),   consistent with normal central venous pressure.  Impressions:  - LVEF 60-65%, normal wall thickness, normal wall motion, normal   diastolic function, normal LA size, trivial AI, normal IVC.  Notes recorded by Vincent Ibarra, Christopher, MD on 03/02/2017 at 11:07 AM EST Please let Vincent Ibarra know that his echo shows that his heart is normal in size and contracting well. There is minimal leakage of the aortic valve, which is unlikely to be causing his symptoms. We will follow-up with him again after completion of the 24-hour urine collection has been completed.   Assessment/Plan:  1. HTN - BP is ok.   2. Multitude of somatic complaints - not felt to be cardiac related. He wanted to have his medicines "changed" due to these complaints but is not agreeable to stopping Coreg altogether. His prior chest pain has not felt to be cardiac related and he was to see his PCP. Explained that ACE inhibitors would be Ibarra good choice given his ethnicity and pre-diabetes. He is not willing to entertain this idea.   3. Bradycardia - on Coreg. Does not wish to stop and  discontinue - which I have suggested given his marked bradycardia. He is adamant about not stopping. Said several times during the visit that I (me) "obviously do not know what you are talking about".  We will try Lopressor 25 - taking 12.5 mg BID. He is asked to monitor his heart rate and come back for follow up with Vincent Ibarra.   4. Chronic chest pain - negative cardiac work up - normal Coronary CT Ibarra year ago.    Current medicines  are reviewed with the patient today.  The patient does not have concerns regarding medicines other than what has been noted above.  The following changes have been made:  See above.  Labs/ tests ordered today include:    Orders Placed This Encounter  Procedures  . EKG 12-Lead     Disposition:   FU with Dr. Antoine Poche - unclear to me who he was to establish with. I do not think that I am able to help him going further.   Patient is agreeable to this plan and will call if any problems develop in the interim.   SignedNorma Fredrickson, NP  04/15/2018 12:01 PM  [facility] [address] [place], [place]  [address] Phone: [phone] Fax: [phone]

## 2018-04-15 NOTE — Patient Instructions (Addendum)
We will be checking the following labs today - NONE   Medication Instructions:    Continue with your current medicines. BUT  STOP Coreg  Will try Metoprolol 25 mg - take 1/2 tablet twice a day   If you need a refill on your cardiac medications before your next appointment, please call your pharmacy.     Testing/Procedures To Be Arranged:  N/A  Follow-Up:   See Dr. Antoine Poche in one month. His office will be in touch with an appointment time.     At [facility], you and your health needs are our priority.  As part of our continuing mission to provide you with exceptional heart care, we have created designated Provider Care Teams.  These Care Teams include your primary Cardiologist (physician) and Advanced Practice Providers (APPs -  Physician Assistants and Nurse Practitioners) who all work together to provide you with the care you need, when you need it.  Special Instructions:  . None  Call the [facility] office at [phone] if you have any questions, problems or concerns.

## 2018-04-16 ENCOUNTER — Encounter: Payer: BLUE CROSS/BLUE SHIELD | Admitting: Family Medicine

## 2018-04-16 NOTE — Progress Notes (Deleted)
   Subjective:    I'm seeing this patient as a consultation for:  Kuneff, Renee A, DO   CC: Foot numbness and tingling  HPI: Patient notes right foot numbness and tingling.  Symptoms are typically in the anterior foot but sometimes radiate from the lower leg.  He cannot recall any injury.  He notes that symptoms are typically worse with ***    Past medical history, Surgical history, Family history not pertinant except as noted below, Social history, Allergies, and medications have been entered into the medical record, reviewed, and no changes needed.   Review of Systems: No headache, visual changes, nausea, vomiting, diarrhea, constipation, dizziness, abdominal pain, skin rash, fevers, chills, night sweats, weight loss, swollen lymph nodes, body aches, joint swelling, muscle aches, chest pain, shortness of breath, mood changes, visual or auditory hallucinations.   Objective:   There were no vitals filed for this visit. General: Well Developed, well nourished, and in no acute distress.  Neuro/Psych: Alert and oriented x3, extra-ocular muscles intact, able to move all 4 extremities, sensation grossly intact. Skin: Warm and dry, no rashes noted.  Respiratory: Not using accessory muscles, speaking in full sentences, trachea midline.  Cardiovascular: Pulses palpable, no extremity edema. Abdomen: Does not appear distended. MSK: ***  Lab and Radiology Results No results found for this or any previous visit (from the past 72 hour(s)). No results found.  Impression and Recommendations:    Assessment and Plan: 28 y.o. male with ***.  PDMP not reviewed this encounter. No orders of the defined types were placed in this encounter.  No orders of the defined types were placed in this encounter.   Discussed warning signs or symptoms. Please see discharge instructions. Patient expresses understanding.

## 2018-05-28 ENCOUNTER — Encounter: Payer: BLUE CROSS/BLUE SHIELD | Admitting: Family Medicine

## 2018-06-03 ENCOUNTER — Ambulatory Visit (INDEPENDENT_AMBULATORY_CARE_PROVIDER_SITE_OTHER): Payer: BLUE CROSS/BLUE SHIELD | Admitting: Family Medicine

## 2018-06-03 ENCOUNTER — Encounter: Payer: Self-pay | Admitting: Neurology

## 2018-06-03 ENCOUNTER — Other Ambulatory Visit: Payer: Self-pay

## 2018-06-03 ENCOUNTER — Encounter: Payer: Self-pay | Admitting: Family Medicine

## 2018-06-03 VITALS — BP 133/71 | HR 52 | Temp 98.0°F | Resp 16 | Ht 73.0 in | Wt 191.5 lb

## 2018-06-03 DIAGNOSIS — R202 Paresthesia of skin: Secondary | ICD-10-CM

## 2018-06-03 DIAGNOSIS — R200 Anesthesia of skin: Secondary | ICD-10-CM

## 2018-06-03 NOTE — Progress Notes (Signed)
Vincent Ibarra , 01-19-1991, 28 y.o., male MRN: [id number] Patient Care Team    Relationship Specialty Notifications Start End  Natalia Leatherwood, DO PCP - General Family Medicine  04/23/17   Rollene Rotunda, MD PCP - Cardiology Cardiology  03/02/18   Flo Shanks, MD Consulting Physician Otolaryngology  04/23/17   Rollene Rotunda, MD Consulting Physician Cardiology  07/06/17   Leslye Peer, MD Consulting Physician Pulmonary Disease  07/06/17   Meryl Dare, MD Consulting Physician Gastroenterology  07/06/17     Chief Complaint  Patient presents with  . Referral    Pt would like a referral to [facility] for R foot tingling, pain, Sharp pains      Subjective: Pt presents for an OV with complaints of continued paresthesias of lower extremities.  His symptoms are now worsening.  Symptoms had been mostly affecting his right ankle and foot (> 1 year). He was referred to sports med for this issue, but did not attend appt. He continues to worry it is caused by diabetes or a systemic reason. His fasting glucose and random glucoses have been normal.  CMP, iron, CRP, CBC, sed rate, TSH and ANA have all been normal over the last year.   Over the last 1-2 months his left foot has started to be affected. He reports sensation occurs mostly after sitting long and will last for hours. He describes the symptoms in many ways- sometimes as tingling, numbness, pain or "vibratory" sensation. He denies any injuries to back or lower extremities. He is very active. He denies any weakness to lower extremities. He denies any temperature change or skin changes of lower extremities feet. He has been offered cymbalta, muscle relaxers and gabapentin. He has declined medications.  Prior note:  Patient presents today and would like to discuss his foot pain.  He states that whenever he eats something sweet, whether sugary or fruit, he has a numbness/tingling sensation that occurs anterior foot, ankle and sometimes  below his knee.  He states this only occurs on his right lower extremity.  He does workout quite frequently.  He does not recall any injury. Prior note 05/10/2017:  Pt presents for an OVto review workup surrounding multiple complaints voiced on establishment visit. Today patient complains of mild right foot numbness along his medial arch, 4 foot large toe. He is concerned about fibromyalgia, peripheral artery disease and diabetes. He also endorses continued leg cramps in his bilateral thighs. Patient was seen in the emergency room on 04/28/2017 for this condition with a normal CBC and BMP. His original complaint on presentation on establishment was dyspnea which has been worked up through cardiology. He does have hypertension which is being treated by cardiology. He feels his dyspnea improved after starting iron. He was encouraged to have a sleep study and hasn't scheduled a sleep study for the end of February  Depression screen [facility] 2/9 04/10/2018 04/23/2017  Decreased Interest 2 0  Down, Depressed, Hopeless 1 0  PHQ - 2 Score 3 0  Altered sleeping 2 -  Tired, decreased energy 2 -  Change in appetite 0 -  Feeling bad or failure about yourself  0 -  Trouble concentrating 0 -  Moving slowly or fidgety/restless 0 -  Suicidal thoughts 0 -  PHQ-9 Score 7 -  Difficult doing work/chores Somewhat difficult -    No Known Allergies Social History   Social History Narrative   Single. Works as an Art gallery manager.    College educated .  Routine exercise.   Takes a daily vitamin.   Uses herbal remedies.   Smoke alarm in the home. Her seatbelt.   Feels safe in his relationships.   Past Medical History:  Diagnosis Date  . Anemia    "since childhood" denies Sickle cell history  . Anxiety   . Blood in stool   . Hypertension   . Mild aortic insufficiency   . Mononucleosis   . Snoring 2019   pt reported apnea sx, seen at pulm and was not felt to be sleep apnea.    Past Surgical History:  Procedure  Laterality Date  . NO PAST SURGERIES     Family History  Problem Relation Age of Onset  . Hypertension Mother   . Lupus Mother   . Hypertension Father   . Diabetes Father   . Hypertension Sister   . Mental illness Brother    Allergies as of 06/03/2018   No Known Allergies     Medication List       Accurate as of June 03, 2018 10:14 AM. Always use your most recent med list.        amLODipine 10 MG tablet Commonly known as:  NORVASC Take 1 tablet (10 mg total) by mouth daily.       All past medical history, surgical history, allergies, family history, immunizations andmedications were updated in the EMR today and reviewed under the history and medication portions of their EMR.     ROS: Negative, with the exception of above mentioned in HPI   Objective:  BP 133/71 (BP Location: Right Arm, Patient Position: Sitting, Cuff Size: Normal)   Pulse (!) 52   Temp 98 F (36.7 C) (Oral)   Resp 16   Ht 6\' 1"  (1.854 m)   Wt 191 lb 8 oz (86.9 kg)   SpO2 98%   BMI 25.27 kg/m  Body mass index is 25.27 kg/m. Gen: Afebrile. No acute distress. Nontoxic in appearance, well developed, well nourished.  Skin: no rashes, purpura or petechiae.  Neuro: Normal gait. PERLA. EOMi. Alert. Oriented x3 Cranial nerves II through XII intact. Muscle strength 5/5 bilateral lower extremities.  DTRs equal bilaterally. PT pulses equal bilateral. Cap refill good.   No exam data present No results found. No results found for this or any previous visit (from the past 24 hour(s)).  Assessment/Plan: Vincent Ibarra is a 28 y.o. male present for OV for  Numbness and tingling of right leg/Paresthesia of both feet -Patient has had symptoms in right ankle for greater than 1 year and now complaining of left foot symptoms that can feel similar but only related to the bottom of his left foot.   - Discussed possible plantar fasciitis of the left foot and provided him with instructions on stretches and  how to treat at home. -Uncertain the cause of the chronic right foot symptoms.  Patient would like referral to neurology to discuss.  Consider possible nerve conduction study. -She has been resistant to start Cymbalta and gabapentin -Reviewed prior labs with patient in person again for reassurance. - Ambulatory referral to Neurology     Reviewed expectations re: course of current medical issues.  Discussed self-management of symptoms.  Outlined signs and symptoms indicating need for more acute intervention.  Patient verbalized understanding and all questions were answered.  Patient received an After-Visit Summary.    No orders of the defined types were placed in this encounter.  > 25 minutes spent with patient, >50% of time  spent face to face counseling     Note is dictated utilizing voice recognition software. Although note has been proof read prior to signing, occasional typographical errors still can be missed. If any questions arise, please do not hesitate to call for verification.   electronically signed by:  Felix Pacini, DO  [facility] [place]

## 2018-06-03 NOTE — Patient Instructions (Signed)
Perform stretches as we discussed for both the sciatic nerve and feet/legs.  I have placed the referral for you today for neurology to evaluate further.   Sciatica  Sciatica is pain, numbness, weakness, or tingling along your sciatic nerve. The sciatic nerve starts in the lower back and goes down the back of each leg. Sciatica happens when this nerve is pinched or has pressure put on it. Sciatica usually goes away on its own or with treatment. Sometimes, sciatica may keep coming back (recur). Follow these instructions at home: Medicines  Take over-the-counter and prescription medicines only as told by your doctor.  Do not drive or use heavy machinery while taking prescription pain medicine. Managing pain  If directed, put ice on the affected area. ? Put ice in a plastic bag. ? Place a towel between your skin and the bag. ? Leave the ice on for 20 minutes, 2-3 times a day.  After icing, apply heat to the affected area before you exercise or as often as told by your doctor. Use the heat source that your doctor tells you to use, such as a moist heat pack or a heating pad. ? Place a towel between your skin and the heat source. ? Leave the heat on for 20-30 minutes. ? Remove the heat if your skin turns bright red. This is especially important if you are unable to feel pain, heat, or cold. You may have a greater risk of getting burned. Activity  Return to your normal activities as told by your doctor. Ask your doctor what activities are safe for you. ? Avoid activities that make your sciatica worse.  Take short rests during the day. Rest in a lying or standing position. This is usually better than sitting to rest. ? When you rest for a long time, do some physical activity or stretching between periods of rest. ? Avoid sitting for a long time without moving. Get up and move around at least one time each hour.  Exercise and stretch regularly, as told by your doctor.  Do not lift anything  that is heavier than 10 lb (4.5 kg) while you have symptoms of sciatica. ? Avoid lifting heavy things even when you do not have symptoms. ? Avoid lifting heavy things over and over.  When you lift objects, always lift in a way that is safe for your body. To do this, you should: ? Bend your knees. ? Keep the object close to your body. ? Avoid twisting. General instructions  Use good posture. ? Avoid leaning forward when you are sitting. ? Avoid hunching over when you are standing.  Stay at a healthy weight.  Wear comfortable shoes that support your feet. Avoid wearing high heels.  Avoid sleeping on a mattress that is too soft or too hard. You might have less pain if you sleep on a mattress that is firm enough to support your back.  Keep all follow-up visits as told by your doctor. This is important. Contact a doctor if:  You have pain that: ? Wakes you up when you are sleeping. ? Gets worse when you lie down. ? Is worse than the pain you have had in the past. ? Lasts longer than 4 weeks.  You lose weight for without trying. Get help right away if:  You cannot control when you pee (urinate) or poop (have a bowel movement).  You have weakness in any of these areas and it gets worse. ? Lower back. ? Lower belly (pelvis). ?  Butt (buttocks). ? Legs.  You have redness or swelling of your back.  You have a burning feeling when you pee. This information is not intended to replace advice given to you by your health care provider. Make sure you discuss any questions you have with your health care provider. Document Released: 12/07/2007 Document Revised: 08/05/2015 Document Reviewed: 11/06/2014 Elsevier Interactive Patient Education  2019 [employer] Inc[employer]   Plantar Fasciitis  Plantar fasciitis is a painful foot condition that affects the heel. It occurs when the band of tissue that connects the toes to the heel bone (plantar fascia) becomes irritated. This can happen as the result  of exercising too much or doing other repetitive activities (overuse injury). The pain from plantar fasciitis can range from mild irritation to severe pain that makes it difficult to walk or move. The pain is usually worse in the morning after sleeping, or after sitting or lying down for a while. Pain may also be worse after long periods of walking or standing. What are the causes? This condition may be caused by:  Standing for long periods of time.  Wearing shoes that do not have good arch support.  Doing activities that put stress on joints (high-impact activities), including running, aerobics, and ballet.  Being overweight.  An abnormal way of walking (gait).  Tight muscles in the back of your lower leg (calf).  High arches in your feet.  Starting a new athletic activity. What are the signs or symptoms? The main symptom of this condition is heel pain. Pain may:  Be worse with first steps after a time of rest, especially in the morning after sleeping or after you have been sitting or lying down for a while.  Be worse after long periods of standing still.  Decrease after 30-45 minutes of activity, such as gentle walking. How is this diagnosed? This condition may be diagnosed based on your medical history and your symptoms. Your health care provider may ask questions about your activity level. Your health care provider will do a physical exam to check for:  A tender area on the bottom of your foot.  A high arch in your foot.  Pain when you move your foot.  Difficulty moving your foot. You may have imaging tests to confirm the diagnosis, such as:  X-rays.  Ultrasound.  MRI. How is this treated? Treatment for plantar fasciitis depends on how severe your condition is. Treatment may include:  Rest, ice, applying pressure (compression), and raising the affected foot (elevation). This may be called RICE therapy. Your health care provider may recommend RICE therapy along with  over-the-counter pain medicines to manage your pain.  Exercises to stretch your calves and your plantar fascia.  A splint that holds your foot in a stretched, upward position while you sleep (night splint).  Physical therapy to relieve symptoms and prevent problems in the future.  Injections of steroid medicine (cortisone) to relieve pain and inflammation.  Stimulating your plantar fascia with electrical impulses (extracorporeal shock wave therapy). This is usually the last treatment option before surgery.  Surgery, if other treatments have not worked after 12 months. Follow these instructions at home:  Managing pain, stiffness, and swelling  If directed, put ice on the painful area: ? Put ice in a plastic bag, or use a frozen bottle of water. ? Place a towel between your skin and the bag or bottle. ? Roll the bottom of your foot over the bag or bottle. ? Do this for 20 minutes,  2-3 times a day.  Wear athletic shoes that have air-sole or gel-sole cushions, or try wearing soft shoe inserts that are designed for plantar fasciitis.  Raise (elevate) your foot above the level of your heart while you are sitting or lying down. Activity  Avoid activities that cause pain. Ask your health care provider what activities are safe for you.  Do physical therapy exercises and stretches as told by your health care provider.  Try activities and forms of exercise that are easier on your joints (low-impact). Examples include swimming, water aerobics, and biking. General instructions  Take over-the-counter and prescription medicines only as told by your health care provider.  Wear a night splint while sleeping, if told by your health care provider. Loosen the splint if your toes tingle, become numb, or turn cold and blue.  Maintain a healthy weight, or work with your health care provider to lose weight as needed.  Keep all follow-up visits as told by your health care provider. This is important.  Contact a health care provider if you:  Have symptoms that do not go away after caring for yourself at home.  Have pain that gets worse.  Have pain that affects your ability to move or do your daily activities. Summary  Plantar fasciitis is a painful foot condition that affects the heel. It occurs when the band of tissue that connects the toes to the heel bone (plantar fascia) becomes irritated.  The main symptom of this condition is heel pain that may be worse after exercising too much or standing still for a long time.  Treatment varies, but it usually starts with rest, ice, compression, and elevation (RICE therapy) and over-the-counter medicines to manage pain. This information is not intended to replace advice given to you by your health care provider. Make sure you discuss any questions you have with your health care provider. Document Released: 11/22/2000 Document Revised: 12/25/2016 Document Reviewed: 12/25/2016 Elsevier Interactive Patient Education  2019 [employer].

## 2018-06-04 ENCOUNTER — Encounter: Payer: Self-pay | Admitting: Family Medicine

## 2018-06-07 ENCOUNTER — Other Ambulatory Visit: Payer: Self-pay

## 2018-06-07 ENCOUNTER — Encounter: Payer: BLUE CROSS/BLUE SHIELD | Admitting: Family Medicine

## 2018-06-07 MED ORDER — AMLODIPINE BESYLATE 10 MG PO TABS: 10.0000 mg | ORAL_TABLET | Freq: Every day | ORAL | 1 refills | Status: DC

## 2018-06-07 NOTE — Telephone Encounter (Signed)
Faxed refill request for Amlodipine 10mg   LOV: For HTN 07/06/2017 LOV 06/03/2018 Acute visit  NOV: Not scheduled  Last refill: 07/06/2017 #90 x2  Please Advise

## 2018-06-25 ENCOUNTER — Telehealth: Payer: Self-pay | Admitting: Neurology

## 2018-06-25 NOTE — Telephone Encounter (Signed)
OK for e-visit.  

## 2018-06-25 NOTE — Telephone Encounter (Signed)
Patient is wanting a sooner appt. His symptoms have gotten worse. Can he come in or do an E-visit? Please let us know so we can get him scheduled. Thanks!

## 2018-06-25 NOTE — Telephone Encounter (Signed)
Please advise 

## 2018-07-02 ENCOUNTER — Encounter: Payer: Self-pay | Admitting: *Deleted

## 2018-07-02 ENCOUNTER — Telehealth (INDEPENDENT_AMBULATORY_CARE_PROVIDER_SITE_OTHER): Payer: BLUE CROSS/BLUE SHIELD | Admitting: Neurology

## 2018-07-02 ENCOUNTER — Encounter: Payer: Self-pay | Admitting: Neurology

## 2018-07-02 ENCOUNTER — Other Ambulatory Visit: Payer: Self-pay

## 2018-07-02 ENCOUNTER — Other Ambulatory Visit: Payer: Self-pay | Admitting: *Deleted

## 2018-07-02 VITALS — Ht 73.0 in | Wt 190.0 lb

## 2018-07-02 DIAGNOSIS — R202 Paresthesia of skin: Secondary | ICD-10-CM | POA: Diagnosis not present

## 2018-07-02 DIAGNOSIS — M79604 Pain in right leg: Secondary | ICD-10-CM

## 2018-07-02 DIAGNOSIS — M79605 Pain in left leg: Principal | ICD-10-CM

## 2018-07-02 NOTE — Progress Notes (Signed)
New Patient Virtual Visit via Video Note The purpose of this virtual visit is to provide medical care while limiting exposure to the novel coronavirus.    Consent was obtained for video visit:  Yes.   Answered questions that patient had about telehealth interaction:  Yes.   I discussed the limitations, risks, security and privacy concerns of performing an evaluation and management service by telemedicine. I also discussed with the patient that there may be a patient responsible charge related to this service. The patient expressed understanding and agreed to proceed.  Pt location: Home Physician Location: office Name of referring provider:  Ma Hillock, DO I connected with Vincent Ibarra at patients initiation/request on 07/02/2018 at 10:00 AM EDT by video enabled telemedicine application and verified that I am speaking with the correct person using two identifiers. Pt MRN:  [id number] Pt DOB:  02-12-1991 Video Participants:  Vincent Ibarra    History of Present Illness: Vincent Ibarra is a 28 y.o. right-handed African American male with hypertension and anxiety presenting for evaluation of bilateral feet paresthesias.   Starting around mid-2019, he began having tingling over the dorsum and sole of the right foot.  Over the past few months, he also began having similar sensation in the right foot.  Symptoms occur daily, lasting about 20-minutes.  He has noticed that tingling is triggered by sweet foods and liquids and drinking water resolves it quickly.  He does not have associated weakness, low back pain, or imbalance.  Labs include ESR, TSH, ANA, CRP, CBC, and CMP has been normal.  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   Out-side paper records, electronic medical record, and images have been reviewed where available and summarized as:  Lab Results  Component Value Date   HGBA1C 5.8 04/10/2018   No results found for: [id number]  Lab Results  Component Value Date   TSH 1.26 04/23/2017   Lab Results  Component Value Date   ESRSEDRATE 13 04/30/2017    Past Medical History:  Diagnosis Date  . Anemia    "since childhood" denies Sickle cell history  . Anxiety   . Blood in stool   . Hypertension   . Mild aortic insufficiency   . Mononucleosis   . Snoring 2019   pt reported apnea sx, seen at pulm and was not felt to be sleep apnea.     Past Surgical History:  Procedure Laterality Date  . NO PAST SURGERIES       Medications:  Outpatient Encounter Medications as of 07/02/2018  Medication Sig  . amLODipine (NORVASC) 10 MG tablet Take 1 tablet (10 mg total) by mouth daily.   No facility-administered encounter medications on file as of 07/02/2018.     Allergies: No Known Allergies  Family History: Family History  Problem Relation Age of Onset  . Hypertension Mother   . Lupus Mother   . Hypertension Father   . Diabetes Father   . Hypertension Sister   . Mental illness Brother     Social History: Social History   Tobacco Use  . Smoking status: Former Smoker    Types: Cigars    Last attempt to quit: 05/29/2016    Years since quitting: 2.0  . Smokeless tobacco: Never Used  Substance Use Topics  . Alcohol use: No    Frequency: Never  . Drug use: No    Types: Marijuana    Comment: college years  Social History   Social History Narrative   Single. Works as an Chief Financial Officer.    College educated .   Routine exercise.   Takes a daily vitamin.   Uses herbal remedies.   Smoke alarm in the home. Uses seatbelt.   Feels safe in his relationships.    Review of Systems:  CONSTITUTIONAL: No fevers, chills, night sweats, or weight loss.   EYES: No visual changes or eye pain ENT: No hearing changes.  No history of nose bleeds.   RESPIRATORY: No cough, wheezing and shortness of breath.   CARDIOVASCULAR: Negative for chest pain, and palpitations.   GI: Negative for abdominal discomfort, blood in  stools or black stools.  No recent change in bowel habits.   GU:  No history of incontinence.   MUSCLOSKELETAL: No history of joint pain or swelling.  No myalgias.   SKIN: Negative for lesions, rash, and itching.   HEMATOLOGY/ONCOLOGY: Negative for prolonged bleeding, bruising easily, and swollen nodes.  No history of cancer.  ENDOCRINE: Negative for cold or heat intolerance, polydipsia or goiter.   PSYCH:  No depression or anxiety symptoms.   NEURO: As Above.+   Vital Signs:  Ht '6\' 1"'  (1.854 m)   Wt 190 lb (86.2 kg)   BMI 25.07 kg/m    General Medical Exam:  Well appearing, comfortable.  Nonlabored breathing.  No deformity or edema.  No rash.  Neurological Exam: MENTAL STATUS including orientation to time, place, person, recent and remote memory, attention span and concentration, language, and fund of knowledge is normal.  Speech is not dysarthric.  CRANIAL NERVES:  Normal conjugate, extra-ocular eye movements in all directions of gaze.  No ptosis.  Normal facial symmetry and movements.  Normal shoulder shrug and head rotation.  Tongue is midline.  MOTOR:  Antigravity in all extremities.  Full ROM of dorsiflexion and plantar flexion. No abnormal movements.  No pronator drift.   SENSORY:  Negative Romberg's sign absent.   COORDINATION/GAIT: Normal finger to nose bilaterally.  Intact rapid alternating movements bilaterally.  Able to rise from a chair without using arms.  Gait narrow based and stable. Tandem and stressed gait intact.    IMPRESSION/PLAN: Bilateral feet paresthesias, based on history symptoms do not fit a dermatomal or cutaneous nerve distribution and symptoms quickly resolving with drinking water is unusual for primary nerve pathology.  Reassurance was provided.  I recommend NCS/EMG of the legs to better characterize the nature of his symptoms.  If this is abnormal, additional labs will be checked.     Follow Up Instructions:  I discussed the assessment and treatment  plan with the patient. The patient was provided an opportunity to ask questions and all were answered. The patient agreed with the plan and demonstrated an understanding of the instructions.   The patient was advised to call back or seek an in-person evaluation if the symptoms worsen or if the condition fails to improve as anticipated.  Return to clinic after testing    Alda Berthold, DO

## 2018-08-01 ENCOUNTER — Encounter: Payer: Self-pay | Admitting: Neurology

## 2018-08-02 ENCOUNTER — Telehealth: Payer: Self-pay | Admitting: Neurology

## 2018-08-02 NOTE — Telephone Encounter (Signed)
Error

## 2018-08-12 ENCOUNTER — Ambulatory Visit: Payer: BLUE CROSS/BLUE SHIELD | Admitting: Neurology

## 2018-08-22 ENCOUNTER — Ambulatory Visit (INDEPENDENT_AMBULATORY_CARE_PROVIDER_SITE_OTHER): Payer: BC Managed Care – PPO | Admitting: Neurology

## 2018-08-22 ENCOUNTER — Other Ambulatory Visit: Payer: Self-pay

## 2018-08-22 ENCOUNTER — Encounter: Payer: Self-pay | Admitting: Neurology

## 2018-08-22 ENCOUNTER — Other Ambulatory Visit (INDEPENDENT_AMBULATORY_CARE_PROVIDER_SITE_OTHER): Payer: BC Managed Care – PPO

## 2018-08-22 VITALS — BP 124/80 | HR 63 | Ht 72.0 in | Wt 185.0 lb

## 2018-08-22 DIAGNOSIS — M79604 Pain in right leg: Secondary | ICD-10-CM

## 2018-08-22 DIAGNOSIS — M79605 Pain in left leg: Secondary | ICD-10-CM

## 2018-08-22 DIAGNOSIS — R202 Paresthesia of skin: Secondary | ICD-10-CM

## 2018-08-22 DIAGNOSIS — R140 Abdominal distension (gaseous): Secondary | ICD-10-CM

## 2018-08-22 NOTE — Progress Notes (Signed)
Kieth Brightly stated that the B12 and Folate panel did not come through and that it needed to be reordered.  New order placed.

## 2018-08-22 NOTE — Progress Notes (Signed)
Follow-up Visit   Date: 08/22/18   Vincent Ibarra MRN: [id number] DOB: 04/30/1990   Interim History: Vincent Ibarra is a 28 y.o. right-handed male with hypertension and axiety returning to the clinic for follow-up of bilateral feet paresthesias.  The patient was accompanied to the clinic by self.  History of present illness: Starting around mid-2019, he began having tingling over the dorsum and sole of the right foot.  Over the past few months, he also began having similar sensation in the right foot.  Symptoms occur daily, lasting about 20-minutes.  He has noticed that tingling is triggered by sweet foods and liquids and drinking water resolves it quickly.  He does not have associated weakness, low back pain, or imbalance.  Labs include ESR, TSH, ANA, CRP, CBC, and CMP has been normal.  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   UPDATE 08/22/2018:  He is here for follow-up and EDX.  He continues to have numbness/tingling over the dorsum of the feet, symptoms remain intermittent and last about 20 minutes.  He feels that prolonged sitting and high carbohydrate meals tend to trigger his symptoms.  He has been avoiding eating bread and rice because he feels it makes his symptoms worse.  He has not been tested for celiac disease.  Drinking water quickly resolves the tingling.  He also complains left achy hip pain, also triggered by prolonged sitting.  There is no associated radicular pain in the left leg.  He denies any vision changes, numbness or tingling of the hands, or limb weakness.  Medications:  Current Outpatient Medications on File Prior to Visit  Medication Sig Dispense Refill  . amLODipine (NORVASC) 10 MG tablet Take 1 tablet (10 mg total) by mouth daily. 90 tablet 1   No current facility-administered medications on file prior to visit.     Allergies: No Known Allergies  Review of Systems:  CONSTITUTIONAL: No fevers, chills, night  sweats, or weight loss.  EYES: No visual changes or eye pain ENT: No hearing changes.  No history of nose bleeds.   RESPIRATORY: No cough, wheezing and shortness of breath.   CARDIOVASCULAR: Negative for chest pain, and palpitations.   GI: Negative for abdominal discomfort, blood in stools or black stools.  No recent change in bowel habits.   GU:  No history of incontinence.   MUSCLOSKELETAL: No history of joint pain or swelling.  No myalgias.   SKIN: Negative for lesions, rash, and itching.   ENDOCRINE: Negative for cold or heat intolerance, polydipsia or goiter.   PSYCH:  No depression +anxiety symptoms.   NEURO: As Above.   Vital Signs:  BP 124/80   Pulse 63   Ht 6' (1.829 m)   Wt 185 lb (83.9 kg)   SpO2 97%   BMI 25.09 kg/m    Neurological Exam: MENTAL STATUS including orientation to time, place, person, recent and remote memory, attention span and concentration, language, and fund of knowledge is normal.  Speech is not dysarthric.  CRANIAL NERVES:  No visual field defects.  Pupils equal round and reactive to light.  Normal conjugate, extra-ocular eye movements in all directions of gaze.  No ptosis.  Face is symmetric.   MOTOR:  Motor strength is 5/5 in all extremities, including distally in the legs.  No atrophy, fasciculations or abnormal movements.  No pronator drift.  Tone is normal.    MSRs:  Reflexes are 2+/4 throughout.  Downgoing plantar  responses.  SENSORY:  Intact to vibration and temperature throughout.  COORDINATION/GAIT:   Gait narrow based and stable.   Data: NCS/EMG of the legs 08/21/2017:  Normal  IMPRESSION/PLAN: Bilateral feet paresthesias, intermittent and self-limiting.  Patient was reassured that with his normal neurological exam and electrodiagnostic testing, there is no evidence of neuropathy or lumbosacral radiculopathy.  His symptoms seem to be related to various foods and alleviated by water which is unusual for primary nerve pathology.  I will  screen him for celiac disease as well as other nutrient deficiencies including vitamin B12, vitamin B1, folate, copper.  I will call him with the results of these.  For now, recommend monitoring symptoms and return to see me if symptoms get worse or he develops new symptoms.  Greater than 50% of this 25 minute visit was spent in counseling, explanation of diagnosis, planning of further management, and coordination of care.   Thank you for allowing me to participate in patient's care.  If I can answer any additional questions, I would be pleased to do so.    Sincerely,    Donika K. Posey Pronto, DO

## 2018-08-22 NOTE — Patient Instructions (Addendum)
We will check labs and let you know the results via [place].  Stay well hydrated  Please call my office, if your tingling gets worse or does not improve.

## 2018-08-22 NOTE — Procedures (Signed)
[facility]  [address], [address]  [place], [place] [address] Tel: [phone]) [phone] Fax:  [phone] Test Date:  08/22/2018  Patient: Vincent Ibarra DOB: 05/08/1990 Physician: Nita Sickleonika Patel, DO  Sex: Male Height: 6\' 2"  Ref Phys: Nita Sickleonika Patel, DO  ID#: [id number] Temp: 32.8C Technician:    Patient Complaints: This is a 28 year old man referred for evaluation of intermittent bilateral feet paresthesias, worse on the right.  NCV & EMG Findings: Electrodiagnostic testing of the right lower extremity and additional studies of the left shows: 1. Bilateral sural and superficial peroneal sensory responses are within normal limits. 2. Bilateral peroneal and tibial motor responses are within normal limits. 3. Bilateral tibial H reflex studies are within normal limits. 4. There is no evidence of active or chronic motor axonal changes affecting any of the tested muscles.  Motor unit configuration and recruitment pattern is within normal limits.  Impression: This is a normal study of the lower extremities.  In particular, there is no evidence of a sensorimotor polyneuropathy or lumbosacral radiculopathy.     ___________________________ Nita Sickleonika Patel, DO    Nerve Conduction Studies Anti Sensory Summary Table   Site NR Peak (ms) Norm Peak (ms) P-T Amp (V) Norm P-T Amp  Left Sup Peroneal Anti Sensory (Ant Lat Mall)  32.8C  12 cm    2.9 <4.4 12.0 >6  Right Sup Peroneal Anti Sensory (Ant Lat Mall)  32.8C  12 cm    2.3 <4.4 14.2 >6  Left Sural Anti Sensory (Lat Mall)  32.8C  Calf    3.4 <4.4 19.1 >6  Right Sural Anti Sensory (Lat Mall)  32.8C  Calf    3.4 <4.4 16.9 >6   Motor Summary Table   Site NR Onset (ms) Norm Onset (ms) O-P Amp (mV) Norm O-P Amp Site1 Site2 Delta-0 (ms) Dist (cm) Vel (m/s) Norm Vel (m/s)  Left Peroneal Motor (Ext Dig Brev)  32.8C  Ankle    3.8 <5.5 6.7 >3 B Fib Ankle 8.2 39.0 48 >41  B Fib    12.0  6.6  Poplt B Fib 1.7 9.0 53 >41  Poplt    13.7   6.3         Right Peroneal Motor (Ext Dig Brev)  32.8C  Ankle    3.4 <5.5 5.8 >3 B Fib Ankle 8.2 38.0 46 >41  B Fib    11.6  5.4  Poplt B Fib 1.6 9.0 56 >41  Poplt    13.2  4.9         Left Tibial Motor (Abd Hall Brev)  32.8C  Ankle    5.1 <5.8 13.9 >8 Knee Ankle 8.7 45.0 52 >41  Knee    13.8  9.6         Right Tibial Motor (Abd Hall Brev)  32.8C  Ankle    4.4 <5.8 12.7 >8 Knee Ankle 9.7 42.0 43 >41  Knee    14.1  6.9          H Reflex Studies   NR H-Lat (ms) Lat Norm (ms) L-R H-Lat (ms)  Left Tibial (Gastroc)  32.8C     29.93 <35 0.00  Right Tibial (Gastroc)  32.8C     29.93 <35 0.00   EMG   Side Muscle Ins Act Fibs Psw Fasc Number Recrt Dur Dur. Amp Amp. Poly Poly. Comment  Right AntTibialis Nml Nml Nml Nml Nml Nml Nml Nml Nml Nml Nml Nml N/A  Right Gastroc Nml Nml Nml Nml  Nml Nml Nml Nml Nml Nml Nml Nml N/A  Right Flex Dig Long Nml Nml Nml Nml Nml Nml Nml Nml Nml Nml Nml Nml N/A  Right RectFemoris Nml Nml Nml Nml Nml Nml Nml Nml Nml Nml Nml Nml N/A  Right GluteusMed Nml Nml Nml Nml Nml Nml Nml Nml Nml Nml Nml Nml N/A  Left AntTibialis Nml Nml Nml Nml Nml Nml Nml Nml Nml Nml Nml Nml N/A  Left GluteusMed Nml Nml Nml Nml Nml Nml Nml Nml Nml Nml Nml Nml N/A      Waveforms:

## 2018-08-23 LAB — B12 AND FOLATE PANEL
Folate: 11.2 ng/mL
Vitamin B-12: 345 pg/mL (ref 200–1100)

## 2018-08-24 LAB — CELIAC DISEASE AB SCREEN W/RFX
Antigliadin Abs, IgA: 5 units (ref 0–19)
IgA/Immunoglobulin A, Serum: 253 mg/dL (ref 90–386)
Transglutaminase IgA: <2 U/mL (ref 0–3)

## 2018-08-24 LAB — SPECIMEN STATUS REPORT

## 2018-08-26 LAB — VITAMIN B1: Vitamin B1 (Thiamine): 7 nmol/L — ABNORMAL LOW (ref 8–30)

## 2018-08-26 LAB — COPPER, SERUM: Copper: 98 ug/dL (ref 70–175)

## 2018-09-11 ENCOUNTER — Encounter: Payer: Self-pay | Admitting: Family Medicine

## 2018-09-11 ENCOUNTER — Ambulatory Visit: Payer: BC Managed Care – PPO | Admitting: Family Medicine

## 2018-09-11 DIAGNOSIS — Z0289 Encounter for other administrative examinations: Secondary | ICD-10-CM

## 2018-10-23 ENCOUNTER — Telehealth: Payer: Self-pay | Admitting: Family Medicine

## 2018-10-23 NOTE — Telephone Encounter (Signed)
Patient states his feet hurt so he would like to schedule an in office visit to have his sugars checked for diabetes. No appointments available until 12/06/18. Patient does not want to wait that long.

## 2018-10-23 NOTE — Telephone Encounter (Signed)
Pt was called and told we did not have an appt until 11/05/2018, pt stated he has gone to Neuro and they just "told me to take vitamins". Pt states he went off his heart medicine "which keeps my sugars lower and now that I do not take it anymore I want to make sure I do not have diabetes". Pt was scheduled for soonest available.

## 2018-11-05 ENCOUNTER — Ambulatory Visit: Payer: BC Managed Care – PPO | Admitting: Family Medicine

## 2018-11-05 DIAGNOSIS — Z0289 Encounter for other administrative examinations: Secondary | ICD-10-CM

## 2018-11-06 ENCOUNTER — Encounter: Payer: Self-pay | Admitting: Family Medicine

## 2018-11-06 ENCOUNTER — Other Ambulatory Visit: Payer: Self-pay

## 2018-11-06 ENCOUNTER — Ambulatory Visit: Payer: BC Managed Care – PPO | Admitting: Family Medicine

## 2018-11-06 VITALS — BP 114/65 | HR 60 | Temp 98.0°F | Resp 17 | Ht 72.0 in | Wt 189.2 lb

## 2018-11-06 DIAGNOSIS — E538 Deficiency of other specified B group vitamins: Secondary | ICD-10-CM

## 2018-11-06 DIAGNOSIS — R7309 Other abnormal glucose: Secondary | ICD-10-CM

## 2018-11-06 DIAGNOSIS — R202 Paresthesia of skin: Secondary | ICD-10-CM | POA: Diagnosis not present

## 2018-11-06 DIAGNOSIS — E5111 Dry beriberi: Secondary | ICD-10-CM

## 2018-11-06 DIAGNOSIS — R200 Anesthesia of skin: Secondary | ICD-10-CM

## 2018-11-06 MED ORDER — AMLODIPINE BESYLATE 10 MG PO TABS: 10.0000 mg | ORAL_TABLET | Freq: Every day | ORAL | 1 refills | Status: AC

## 2018-11-06 NOTE — Progress Notes (Signed)
Vincent Ibarra , 08-03-1990, 28 y.o., male MRN: [id number] Patient Care Team    Relationship Specialty Notifications Start End  Ma Hillock, DO PCP - General Family Medicine  04/23/17   Minus Breeding, MD PCP - Cardiology Cardiology  74/16/38   Jodi Marble, MD Consulting Physician Otolaryngology  04/23/17   Minus Breeding, MD Consulting Physician Cardiology  07/06/17   Collene Gobble, MD Consulting Physician Pulmonary Disease  07/06/17   Ladene Artist, MD Consulting Physician Gastroenterology  07/06/17   Alda Berthold, [place] Physician Neurology  07/02/18     Chief Complaint  Patient presents with  . Vit B levels    Pt would like Vit B labs checked and Blood sugar levels      Subjective: Pt presents for an OV with continued complaints of numbness and tingling of his lower extremities.  He has been seen by this provider and neurology for this condition.  In June neurology had tested his B vitamins in which she was found to have a lower than normal B1 level B12 below 400.  He has started B1 supplementation and B12 supplementation.  He states he has not seen any difference in his symptoms.  He would like to have his levels retested today.  He also would like to make sure he does not have diabetes.  This has been a concern of his.  A1c was collected in January with normal glucose and A1c of 5.8.  He was on Coreg at that time so he is concerned it was falsely low in his glucose and A1c.  Depression screen [facility] 2/9 04/10/2018 04/23/2017  Decreased Interest 2 0  Down, Depressed, Hopeless 1 0  PHQ - 2 Score 3 0  Altered sleeping 2 -  Tired, decreased energy 2 -  Change in appetite 0 -  Feeling bad or failure about yourself  0 -  Trouble concentrating 0 -  Moving slowly or fidgety/restless 0 -  Suicidal thoughts 0 -  PHQ-9 Score 7 -  Difficult doing work/chores Somewhat difficult -    No Known Allergies Social History   Social History Narrative   Single. Works  as an Chief Financial Officer.    College educated .   Routine exercise.   Takes a daily vitamin.   Uses herbal remedies.   Smoke alarm in the home. Uses seatbelt.   Feels safe in his relationships.   Past Medical History:  Diagnosis Date  . Anemia    "since childhood" denies Sickle cell history  . Anxiety   . Blood in stool   . Hypertension   . Mild aortic insufficiency   . Mononucleosis   . Snoring 2019   pt reported apnea sx, seen at pulm and was not felt to be sleep apnea.    Past Surgical History:  Procedure Laterality Date  . NO PAST SURGERIES     Family History  Problem Relation Age of Onset  . Hypertension Mother   . Lupus Mother   . Hypertension Father   . Diabetes Father   . Hypertension Sister   . Mental illness Brother    Allergies as of 11/06/2018   No Known Allergies     Medication List       Accurate as of November 06, 2018  3:43 PM. If you have any questions, ask your nurse or doctor.        amLODipine 10 MG tablet Commonly known as: NORVASC Take 1 tablet (10 mg total)  by mouth daily.   Vitamin B 12 500 MCG Tabs Take 1 tablet by mouth daily.   vitamin B-1 250 MG tablet Take 250 mg by mouth daily. 1/2 tablet daily       All past medical history, surgical history, allergies, family history, immunizations andmedications were updated in the EMR today and reviewed under the history and medication portions of their EMR.     ROS: Negative, with the exception of above mentioned in HPI   Objective:  BP 114/65 (BP Location: Right Arm, Patient Position: Sitting, Cuff Size: Normal)   Pulse 60   Temp 98 F (36.7 C) (Temporal)   Resp 17   Ht 6' (1.829 m)   Wt 189 lb 4 oz (85.8 kg)   SpO2 98%   BMI 25.67 kg/m  Body mass index is 25.67 kg/m. Gen: Afebrile. No acute distress. Nontoxic in appearance, well developed, well nourished.  HENT: AT. [place].  MMM Eyes:Pupils Equal Round Reactive to light, Extraocular movements intact,  Conjunctiva without redness,  discharge or icterus. Neck/lymp/endocrine: Supple, no thyromegaly CV: RRR no murmur, no edema Chest: CTAB, no wheeze or crackles. Good air movement, normal resp effort.  Skin: No rashes, purpura or petechiae.  Neuro:  Normal gait. PERLA. EOMi. Alert. Oriented x3  Psych: Normal affect, dress and demeanor. Normal speech. Normal thought content and judgment.  No exam data present No results found. No results found for this or any previous visit (from the past 24 hour(s)).  Assessment/Plan: [name] is a 28 y.o. male present for OV for  Numbness and tingling of right leg/B12 deficiency/B1 deficiency Patient has not seen any improvement in his symptoms since starting his B1 and B12 vitamins.  He is taking B1 125 mg daily and B12 250 mcg daily. - Vitamin B1 - B12 - Glucose  Elevated hemoglobin A1c 5.8 last collection in January.  Will retest today. - Glucose - Hemoglobin A1c  Pt is moving next week and no longer will be established with this office. Refilled amlodipine for him to allow him time to find a new PCP where he is moving to.   Reviewed expectations re: course of current medical issues.  Discussed self-management of symptoms.  Outlined signs and symptoms indicating need for more acute intervention.  Patient verbalized understanding and all questions were answered.  Patient received an After-Visit Summary.    No orders of the defined types were placed in this encounter.    Note is dictated utilizing voice recognition software. Although note has been proof read prior to signing, occasional typographical errors still can be missed. If any questions arise, please do not hesitate to call for verification.   electronically signed by:  Felix Pacinienee Wilmina Maxham, DO  [facility] [place]

## 2018-11-06 NOTE — Patient Instructions (Signed)
We will call you with results of labs once we get them.  Good luck on the move.

## 2018-11-07 LAB — GLUCOSE, RANDOM: Glucose, Bld: 96 mg/dL (ref 70–99)

## 2018-11-07 LAB — HEMOGLOBIN A1C: Hgb A1c MFr Bld: 5.9 % (ref 4.6–6.5)

## 2018-11-07 LAB — VITAMIN B12: Vitamin B-12: 724 pg/mL (ref 211–911)

## 2018-11-10 LAB — VITAMIN B1: Vitamin B1 (Thiamine): 37 nmol/L — ABNORMAL HIGH (ref 8–30)

## 2019-02-28 ENCOUNTER — Ambulatory Visit: Payer: BC Managed Care – PPO | Admitting: Neurology

## 2019-05-12 ENCOUNTER — Ambulatory Visit: Payer: BC Managed Care – PPO | Admitting: Neurology

## 2019-08-05 IMAGING — CT CT HEART MORP W/ CTA COR W/ SCORE W/ CA W/CM &/OR W/O CM
4 of 7 series · 8 of 20 positions shown, 9 images · non-contrast
Comparison: None.

CLINICAL DATA: Chest pain

EXAM:
Cardiac CTA
MEDICATIONS:
Sub lingual nitro. 4mg and lopressor 5mg
TECHNIQUE: The patient was scanned on a Siemens Force [REDACTED]ice scanner. Gantry
rotation speed was 250 msecs. Collimation was. 6 mm . A 100 kV
prospective scan was triggered in the ascending thoracic aorta at
140 HU's with full mA between 35-75% of the R-R interval. Average HR
during the scan was 68 bpm. The 3D data set was interpreted on a
dedicated work station using MPR, MIP and VRT modes. A total of 80cc
of contrast was used.

[Series 6: best diast 73 % · axial · 0.62mm/px · z∈[+1118,+1171]mm · 2 of 402 slices shown, 3 images]
[im 134/402  vessel]
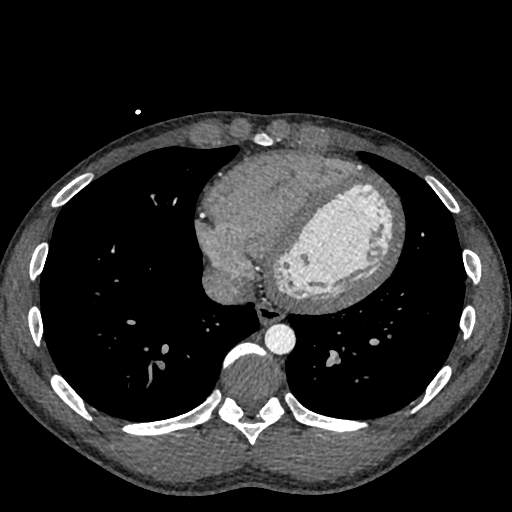
[im 134/402  lung]
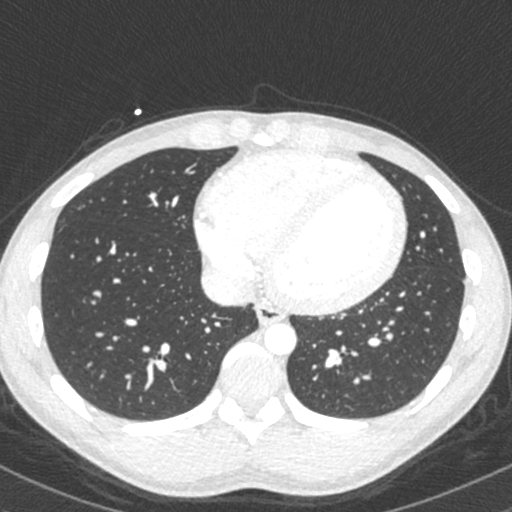
[im 268/402  vessel]
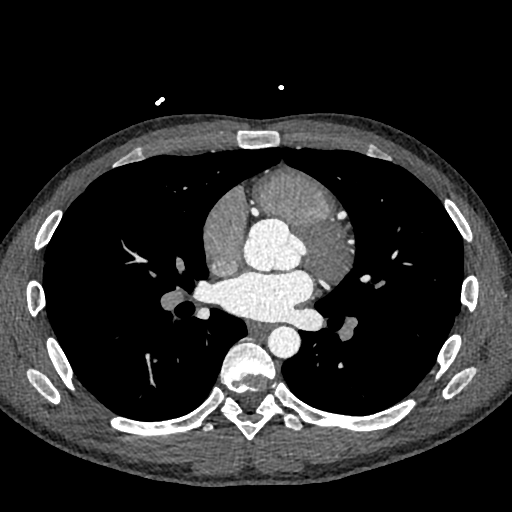

[Series 7: best syst 36 % · axial · 0.62mm/px · z∈[+1118,+1171]mm · 2 of 402 slices shown]
[im 134/402  vessel]
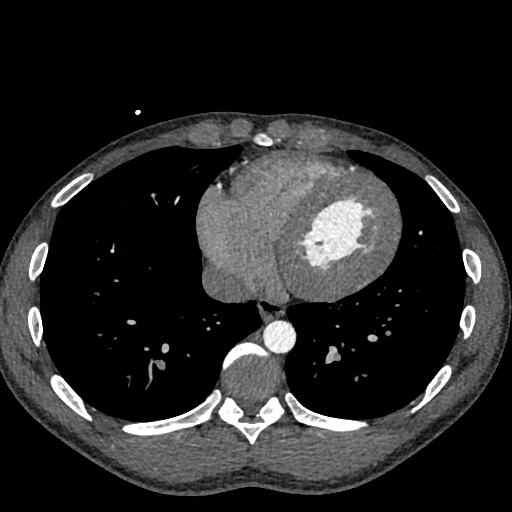
[im 268/402  vessel]
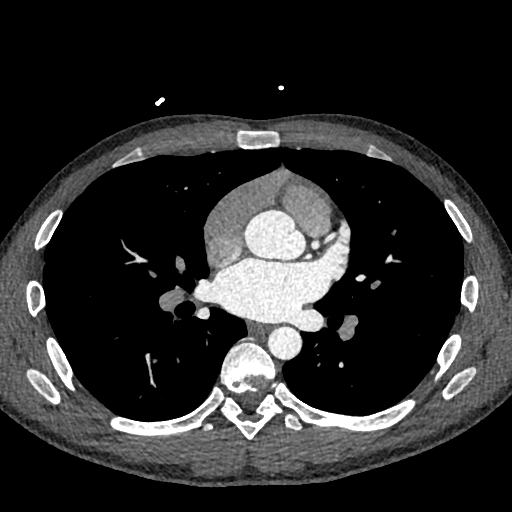

[Series 8: ts diast sharp 73 % · axial · 0.62mm/px · z∈[+1118,+1171]mm · 2 of 402 slices shown]
[im 134/402  lung]
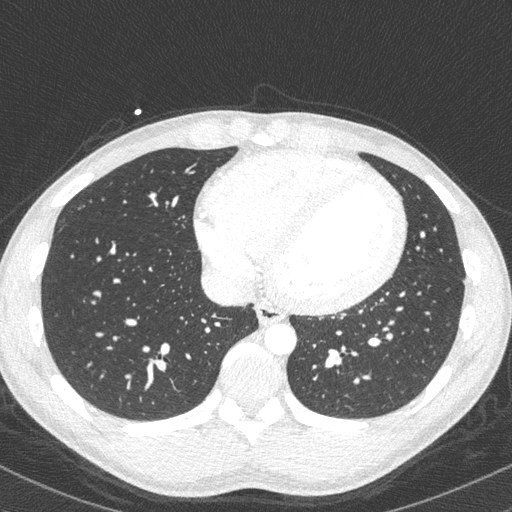
[im 268/402  lung]
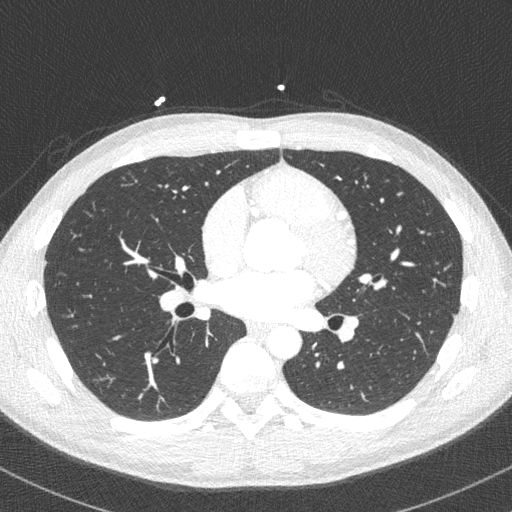

[Series 9: ts syst sharp 36 % · axial · 0.62mm/px · z∈[+1118,+1171]mm · 2 of 402 slices shown]
[im 134/402  lung]
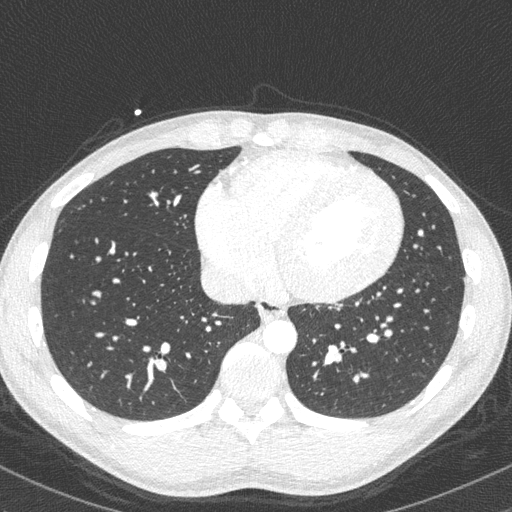
[im 268/402  lung]
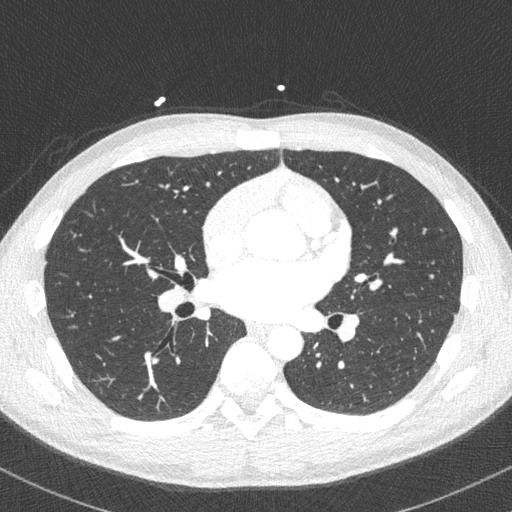

[8 of 20 positions shown; findings below may reference images not displayed]

FINDINGS: Non-cardiac: See separate report from [REDACTED]. No
significant findings on limited lung and soft tissue windows.

Calcium Score: No calcium detected

Normal aortic root 3.3 cm

Coronary Arteries: Right dominant with no anomalies

LM: Normal

LAD: Normal

D1: Normal

D2: Normal

Circumflex: Normal

OM1: Normal

RCA: Dominant and normal

PDA: Normal

PLA: Normal
IMPRESSION: 1.  Calcium score 0

2.  Normal right dominant coronary arteries

3.  Normal aortic root 3.3 cm

Sherion Sobel

EXAM:
OVER-READ INTERPRETATION  CT CHEST

The following report is an over-read performed by radiologist Dr.
Musashi Taberner [[email or web address]] on 04/24/2017. This
over-read does not include interpretation of cardiac or coronary
anatomy or pathology. The coronary calcium score/coronary CTA
interpretation by the cardiologist is attached.
FINDINGS: Within the visualized portions of the thorax there are no suspicious
appearing pulmonary nodules or masses, there is no acute
consolidative airspace disease, no pleural effusions, no
pneumothorax and no lymphadenopathy. Visualized portions of the
upper abdomen are unremarkable. There are no aggressive appearing
lytic or blastic lesions noted in the visualized portions of the
skeleton.
IMPRESSION: 1. No significant incidental noncardiac findings are noted.
